# Patient Record
Sex: Female | Born: 1990 | Hispanic: Yes | Marital: Single | Smoking: Never smoker
Health system: Southern US, Community
[De-identification: ages and names within clinical notes are randomized; demographics above are authoritative.]

## PROBLEM LIST (undated history)

## (undated) ENCOUNTER — Inpatient Hospital Stay (HOSPITAL_COMMUNITY): Payer: Self-pay

---

## 2011-11-17 ENCOUNTER — Emergency Department (HOSPITAL_COMMUNITY)
Admission: EM | Admit: 2011-11-17 | Discharge: 2011-11-17 | Disposition: A | Discharge status: Home / Self Care | Payer: Self-pay | Attending: Emergency Medicine | Admitting: Emergency Medicine

## 2011-11-17 ENCOUNTER — Encounter (HOSPITAL_COMMUNITY): Payer: Self-pay

## 2011-11-17 DIAGNOSIS — IMO0002 Reserved for concepts with insufficient information to code with codable children: Secondary | ICD-10-CM | POA: Insufficient documentation

## 2011-11-17 DIAGNOSIS — Y939 Activity, unspecified: Secondary | ICD-10-CM | POA: Insufficient documentation

## 2011-11-17 DIAGNOSIS — Y929 Unspecified place or not applicable: Secondary | ICD-10-CM | POA: Insufficient documentation

## 2011-11-17 DIAGNOSIS — T169XXA Foreign body in ear, unspecified ear, initial encounter: Secondary | ICD-10-CM | POA: Insufficient documentation

## 2011-11-17 MED ORDER — ANTIPYRINE-BENZOCAINE 5.4-1.4 % OT SOLN
6.0000 [drp] | Freq: Once | OTIC | Status: AC
  Administered 2011-11-17: 6 [drp] via OTIC
  Filled 2011-11-17 (×2): qty 10

## 2011-11-17 MED ORDER — NEOMYCIN-POLYMYXIN-HC 3.5-10000-1 OT SOLN
3.0000 [drp] | Freq: Once | OTIC | Status: AC
  Administered 2011-11-17: 3 [drp] via OTIC
  Filled 2011-11-17: qty 10

## 2011-11-17 NOTE — ED Notes (Signed)
Al Decant PA  In and removed bug from left ear. Pt tolerated without difficulty.

## 2011-11-17 NOTE — ED Notes (Signed)
Woke this am with feeling of something crawling in left ear.

## 2011-11-17 NOTE — ED Provider Notes (Signed)
History     CSN: [id number]  Arrival date & time 11/17/11  0715   First MD Initiated Contact with Patient 11/17/11 0805      Chief Complaint  Patient presents with  . Foreign Body in Ear    (Consider location/radiation/quality/duration/timing/severity/associated sxs/prior treatment) HPI Comments: Pt was awakened early this AM when "something crawled in my ear".  Patient is a 21 y.o. female presenting with foreign body in ear. The history is provided by the patient. No language interpreter was used.  Foreign Body in Ear This is a new problem. The current episode started today. The problem has been unchanged. Pertinent negatives include no chills or fever. Nothing aggravates the symptoms. She has tried nothing for the symptoms.    History reviewed. No pertinent past medical history.  History reviewed. No pertinent past surgical history.  No family history on file.  History  Substance Use Topics  . Smoking status: Never Smoker   . Smokeless tobacco: Not on file  . Alcohol Use: No    OB History    Grav Para Term Preterm Abortions TAB SAB Ect Mult Living                  Review of Systems  Constitutional: Negative for fever and chills.  HENT: Negative for hearing loss and ear discharge.   All other systems reviewed and are negative.    Allergies  Review of patient's allergies indicates no known allergies.  Home Medications  No current outpatient prescriptions on file.  BP 123/85  Pulse 95  Temp 98.5 F (36.9 C) (Oral)  Resp 20  Ht 5' (1.524 m)  Wt 100 lb (45.36 kg)  BMI 19.53 kg/m2  SpO2 99%  LMP 11/14/2011  Physical Exam  Nursing note and vitals reviewed. Constitutional: She is oriented to person, place, and time. She appears well-developed and well-nourished. No distress.  HENT:  Head: Normocephalic and atraumatic.  Right Ear: Hearing, tympanic membrane, external ear and ear canal normal.  Left Ear: Hearing, external ear and ear canal normal.    Insect readily seen moving with ophthalmoscope  Eyes: EOM are normal.  Neck: Normal range of motion.  Cardiovascular: Normal rate and regular rhythm.   Pulmonary/Chest: Effort normal.  Abdominal: Soft. She exhibits no distension. There is no tenderness.  Musculoskeletal: Normal range of motion.  Neurological: She is alert and oriented to person, place, and time.  Skin: Skin is warm and dry.  Psychiatric: She has a normal mood and affect. Judgment normal.    ED Course  FOREIGN BODY REMOVAL Date/Time: 11/17/2011 9:00 AM Performed by: Evalina Field Authorized by: Evalina Field Consent: Verbal consent obtained. Written consent not obtained. Risks and benefits: risks, benefits and alternatives were discussed Consent given by: patient Patient understanding: patient states understanding of the procedure being performed Patient consent: the patient's understanding of the procedure matches consent given Site marked: the operative site was not marked Imaging studies: imaging studies not available Patient identity confirmed: verbally with patient Time out: Immediately prior to procedure a "time out" was called to verify the correct patient, procedure, equipment, support staff and site/side marked as required. Body area: ear Location details: left ear Anesthesia method: canal filled with auralgan and allowed tostay ~ 20 min to kill insect. Anesthetic total: 6 drops Patient sedated: no Patient restrained: no Patient cooperative: yes Localization method: ENT speculum Removal mechanism: alligator forceps (small suction catheter.) Complexity: simple 1 objects recovered. Objects recovered: cockroach Post-procedure assessment: foreign body removed Patient tolerance:  Patient tolerated the procedure well with no immediate complications. Comments: Canal appears irritated from insect scratching.  Will put on tobrex x 5 days.   (including critical care time)  Labs Reviewed - No data to  display No results found.   1. Foreign body       MDM  Cortisporin 3 gtts TID x 5 days.        Evalina Field, [place] 11/17/11 [phone]

## 2011-11-17 NOTE — ED Provider Notes (Signed)
Medical screening examination/treatment/procedure(s) were performed by non-physician practitioner and as supervising physician I was immediately available for consultation/collaboration.   Eladio Dentremont W. Dreama Kuna, MD 11/17/11 2005 

## 2012-02-11 ENCOUNTER — Emergency Department (HOSPITAL_COMMUNITY)
Admission: EM | Admit: 2012-02-11 | Discharge: 2012-02-11 | Disposition: A | Discharge status: Home / Self Care | Payer: Self-pay | Attending: Emergency Medicine | Admitting: Emergency Medicine

## 2012-02-11 ENCOUNTER — Encounter (HOSPITAL_COMMUNITY): Payer: Self-pay

## 2012-02-11 DIAGNOSIS — H538 Other visual disturbances: Secondary | ICD-10-CM | POA: Insufficient documentation

## 2012-02-11 DIAGNOSIS — B0230 Zoster ocular disease, unspecified: Secondary | ICD-10-CM | POA: Insufficient documentation

## 2012-02-11 DIAGNOSIS — B009 Herpesviral infection, unspecified: Secondary | ICD-10-CM

## 2012-02-11 DIAGNOSIS — Z79899 Other long term (current) drug therapy: Secondary | ICD-10-CM | POA: Insufficient documentation

## 2012-02-11 MED ORDER — ACYCLOVIR 400 MG PO TABS: 800.0000 mg | ORAL_TABLET | Freq: Every day | ORAL | Status: DC

## 2012-02-11 MED ORDER — TETRACAINE HCL 0.5 % OP SOLN
2.0000 [drp] | Freq: Once | OPHTHALMIC | Status: AC
  Administered 2012-02-11: 2 [drp] via OPHTHALMIC
  Filled 2012-02-11: qty 2

## 2012-02-11 MED ORDER — FLUORESCEIN SODIUM 1 MG OP STRP
1.0000 | ORAL_STRIP | Freq: Once | OPHTHALMIC | Status: AC
  Administered 2012-02-11: 1 via OPHTHALMIC
  Filled 2012-02-11: qty 1

## 2012-02-11 NOTE — ED Notes (Signed)
Rash on rt. Eye lid.  Small red blisters, itchy

## 2012-02-11 NOTE — ED Notes (Signed)
Pt states she awoke today with a rash above her right eye and right eyelid; pt states she does not recall coming into contact with anything or anything biting her; pt states rash is painful 7/10 "very achy"; pt states pain does radiate to behind her ear; pt denying any rashes anywhere else on her body;

## 2012-02-11 NOTE — ED Provider Notes (Signed)
History    Scribed for No att. providers found, the patient was seen in room TR04C/TR04C . This chart was scribed by Lewanda Rife.  CSN: [id number]  Arrival date & time 02/11/12  1643   First MD Initiated Contact with Patient 02/11/12 1909      Chief Complaint  Patient presents with  . Rash    (Consider location/radiation/quality/duration/timing/severity/associated sxs/prior treatment) HPI Rhonda Mccarthy is a 22 y.o. female who presents to the Emergency Department complaining of constant burning over rash on right eye lid onset this morning. Pt describes pain 7/10 in severity, described as burning, non-radiating. Pt reports rash is non-pruritic. Denies weeping or oozing from the site.  Pt denies use of new cosmetics, lotion, perfume or other new environmental contacts.  Pt also denies fever, scratch to right eye lid, diplopia, and eye pain. Pt reports mild blurry vision of right eye. Pt denies hx of herpes and rash elsewhere. Pt denies any significant past medical hx.     History reviewed. No pertinent past medical history.  History reviewed. No pertinent past surgical history.  No family history on file.  History  Substance Use Topics  . Smoking status: Never Smoker   . Smokeless tobacco: Not on file  . Alcohol Use: No    OB History    Grav Para Term Preterm Abortions TAB SAB Ect Mult Living                  Review of Systems  Constitutional: Negative.  Negative for fever.  HENT: Negative.   Respiratory: Negative.   Cardiovascular: Negative.   Gastrointestinal: Negative.   Musculoskeletal: Negative.   Skin: Positive for rash.  Neurological: Negative.   Hematological: Negative.   Psychiatric/Behavioral: Negative.   All other systems reviewed and are negative.    Allergies  Review of patient's allergies indicates no known allergies.  Home Medications   Current Outpatient Rx  Name  Route  Sig  Dispense  Refill  . ACYCLOVIR 400 MG PO TABS   Oral  Take 2 tablets (800 mg total) by mouth 5 (five) times daily.   50 tablet   0     BP 104/64  Pulse 84  Temp 98.5 F (36.9 C) (Oral)  Resp 17  SpO2 100%  LMP 01/14/2012  Physical Exam  Nursing note and vitals reviewed. Constitutional: She is oriented to person, place, and time. She appears well-developed and well-nourished. No distress.  HENT:  Head: Normocephalic and atraumatic.  Right Ear: Hearing, tympanic membrane, external ear and ear canal normal.  Left Ear: Hearing, tympanic membrane, external ear and ear canal normal.  Nose: Nose normal.  Mouth/Throat: Uvula is midline, oropharynx is clear and moist and mucous membranes are normal. Mucous membranes are not dry and not cyanotic. No oropharyngeal exudate, posterior oropharyngeal edema, posterior oropharyngeal erythema or tonsillar abscesses.       No vesicles noted in the ear canal or on the tip of the nose  Eyes: Conjunctivae normal and EOM are normal. Pupils are equal, round, and reactive to light. Right eye exhibits no discharge and no exudate. No foreign body present in the right eye. Left eye exhibits no discharge and no exudate. No foreign body present in the left eye. Right conjunctiva is not injected. Right conjunctiva has no hemorrhage. Left conjunctiva is not injected. Left conjunctiva has no hemorrhage. No scleral icterus. Right eye exhibits normal extraocular motion and no nystagmus. Left eye exhibits normal extraocular motion and no nystagmus.  Fundoscopic exam:  The right eye shows no exudate, no hemorrhage and no papilledema.       The left eye shows no exudate, no hemorrhage and no papilledema.  Slit lamp exam:      The right eye shows no corneal abrasion, no corneal flare, no corneal ulcer, no foreign body, no hyphema, no fluorescein uptake and no anterior chamber bulge.       The left eye shows no corneal abrasion, no corneal flare, no corneal ulcer, no foreign body, no hyphema, no fluorescein uptake and no  anterior chamber bulge.       Grouped vesicular erythematous rash noted on right lid. Nondraining.   No corneal abrasion or dendritic lesions noted on slit-lamp exam bilaterally. No flora seen uptake bilaterally.  Visual Acuity  -  Bilateral Distance:  20/15 ;  R Distance:  20/15 ;  L Distance:  20/15  Neck: Normal range of motion and full passive range of motion without pain. Neck supple. No spinous process tenderness and no muscular tenderness present. No rigidity. No tracheal deviation and normal range of motion present. No Brudzinski's sign and no Kernig's sign noted.  Cardiovascular: Normal rate, regular rhythm, S1 normal, S2 normal and normal heart sounds.   Pulses:      Radial pulses are 2+ on the right side, and 2+ on the left side.       Dorsalis pedis pulses are 2+ on the right side, and 2+ on the left side.       Posterior tibial pulses are 2+ on the right side, and 2+ on the left side.  Pulmonary/Chest: Effort normal. No respiratory distress. She has no wheezes. She has no rales. She exhibits no tenderness.  Musculoskeletal: Normal range of motion. She exhibits no tenderness.  Neurological: She is alert and oriented to person, place, and time. She has normal reflexes. She displays normal reflexes. No cranial nerve deficit. She exhibits normal muscle tone. Coordination normal.  Skin: Skin is warm and dry. Rash noted.  Psychiatric: She has a normal mood and affect. Her behavior is normal.    ED Course  Procedures (including critical care time)  Labs Reviewed - No data to display No results found.   1. Herpes       MDM  [name] presents with rash concerning for herpes simplex versus herpes zoster.  Pt with Hx of Varicella as a child, but not Hx of Zoster.  Patient denies prodromal state fever or malaise and headache.  She denies pain in her eye or tearing. She does admit to mildly decreased vision but visual acuity exam shows no deficit.  Patient without evidence of  Hutchinson sign or vesicular rash anywhere on the forehead or tip of the nose.  No evidence for nasociliary nerve involvement. Cranial nerves intact.  Fluorescein dye and with lamp without evidence of dendritic ulcer. Patient has no photophobia. A slit lamp exam is without evidence of dendritic lesion or corneal ulceration. Pt treated with acyclovir and given opthalmology f/u as a precaution.  I have also discussed reasons to return immediately to the ER.  Patient expresses understanding and agrees with plan.  1. Medications: acyclovir, usual home medications 2. Treatment: rest, drink plenty of fluids, take medications as prescribed, wash your hands frequently, do not touch your eye 3. Follow Up: Please followup with your primary doctor for discussion of your diagnoses and further evaluation after today's visit; if you do not have a primary care doctor use the resource guide provided to find  one; follow-up with opthamology  I personally performed the services described in this documentation, which was scribed in my presence. The recorded information has been reviewed and is accurate.   Dahlia Client Chaise Passarella, PA-C 02/11/12 2351

## 2012-02-12 NOTE — ED Provider Notes (Signed)
Medical screening examination/treatment/procedure(s) were performed by non-physician practitioner and as supervising physician I was immediately available for consultation/collaboration.   Kierah Goatley M Nyquan Selbe, DO 02/12/12 1608 

## 2012-12-03 ENCOUNTER — Emergency Department (HOSPITAL_COMMUNITY)
Admission: EM | Admit: 2012-12-03 | Discharge: 2012-12-03 | Disposition: A | Discharge status: Home / Self Care | Payer: No Typology Code available for payment source | Attending: Emergency Medicine | Admitting: Emergency Medicine

## 2012-12-03 ENCOUNTER — Emergency Department (HOSPITAL_COMMUNITY): Payer: Self-pay

## 2012-12-03 ENCOUNTER — Encounter (HOSPITAL_COMMUNITY): Payer: Self-pay | Admitting: Emergency Medicine

## 2012-12-03 DIAGNOSIS — Y92410 Unspecified street and highway as the place of occurrence of the external cause: Secondary | ICD-10-CM | POA: Insufficient documentation

## 2012-12-03 DIAGNOSIS — Z79899 Other long term (current) drug therapy: Secondary | ICD-10-CM | POA: Insufficient documentation

## 2012-12-03 DIAGNOSIS — S39012A Strain of muscle, fascia and tendon of lower back, initial encounter: Secondary | ICD-10-CM

## 2012-12-03 DIAGNOSIS — M549 Dorsalgia, unspecified: Secondary | ICD-10-CM

## 2012-12-03 DIAGNOSIS — S335XXA Sprain of ligaments of lumbar spine, initial encounter: Secondary | ICD-10-CM | POA: Insufficient documentation

## 2012-12-03 DIAGNOSIS — Y9389 Activity, other specified: Secondary | ICD-10-CM | POA: Insufficient documentation

## 2012-12-03 MED ORDER — CYCLOBENZAPRINE HCL 10 MG PO TABS: 10.0000 mg | ORAL_TABLET | Freq: Two times a day (BID) | ORAL | Status: DC | PRN

## 2012-12-03 MED ORDER — NAPROXEN 500 MG PO TABS: 500.0000 mg | ORAL_TABLET | Freq: Two times a day (BID) | ORAL | Status: DC
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
## 2012-12-03 NOTE — ED Notes (Signed)
C/o mid- thoracic and lumbar back pain. Pt is in no distress.
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
## 2012-12-06 NOTE — ED Provider Notes (Signed)
Medical screening examination/treatment/procedure(s) were performed by non-physician practitioner and as supervising physician I was immediately available for consultation/collaboration.  EKG Interpretation   None        Claudett Bayly R. Mandolin Falwell, MD 12/06/12 0923 

## 2014-11-17 ENCOUNTER — Encounter (HOSPITAL_COMMUNITY): Payer: Self-pay | Admitting: *Deleted

## 2014-11-17 ENCOUNTER — Inpatient Hospital Stay (HOSPITAL_COMMUNITY)
Admission: AD | Admit: 2014-11-17 | Discharge: 2014-11-17 | Disposition: A | Discharge status: Home / Self Care | Payer: Self-pay | Source: Ambulatory Visit | Attending: Obstetrics and Gynecology | Admitting: Obstetrics and Gynecology

## 2014-11-17 DIAGNOSIS — N949 Unspecified condition associated with female genital organs and menstrual cycle: Secondary | ICD-10-CM

## 2014-11-17 DIAGNOSIS — R1030 Lower abdominal pain, unspecified: Secondary | ICD-10-CM | POA: Insufficient documentation

## 2014-11-17 DIAGNOSIS — R102 Pelvic and perineal pain: Secondary | ICD-10-CM | POA: Insufficient documentation

## 2014-11-17 DIAGNOSIS — O26892 Other specified pregnancy related conditions, second trimester: Secondary | ICD-10-CM | POA: Insufficient documentation

## 2014-11-17 DIAGNOSIS — Z3A16 16 weeks gestation of pregnancy: Secondary | ICD-10-CM | POA: Insufficient documentation

## 2014-11-17 LAB — URINALYSIS, ROUTINE W REFLEX MICROSCOPIC
BILIRUBIN URINE: NEGATIVE
GLUCOSE, UA: NEGATIVE mg/dL
Hgb urine dipstick: NEGATIVE
KETONES UR: 15 mg/dL — AB
LEUKOCYTES UA: NEGATIVE
NITRITE: NEGATIVE
PROTEIN: NEGATIVE mg/dL
Specific Gravity, Urine: 1.02 (ref 1.005–1.030)
Urobilinogen, UA: 0.2 mg/dL (ref 0.0–1.0)
pH: 6.5 (ref 5.0–8.0)

## 2014-11-17 NOTE — Discharge Instructions (Signed)

## 2014-11-17 NOTE — MAU Note (Signed)
Lower abd pain for the last several days, feels like something is "pushing down" when she walks.  Pos HPT last month.  Denies bleeding or discharge.

## 2014-11-17 NOTE — MAU Provider Note (Signed)
History     CSN: [id number]  Arrival date and time: 11/17/14 1013   First Provider Initiated Contact with Patient 11/17/14 1128      Chief Complaint  Patient presents with  . Abdominal Pain   HPI Ms. Rhonda Mccarthy is a 24 y.o. G1P0 at 556w4d who presents to MAU today with complaint of lower abdominal pain x 3 days. The patient states that pain is intermittent and worse with ambulation. She has not taken anything for pain. She denies vaginal bleeding, discharge, UTI symptoms or fever.    OB History    Gravida Para Term Preterm AB TAB SAB Ectopic Multiple Living   1               History reviewed. No pertinent past medical history.  History reviewed. No pertinent past surgical history.  History reviewed. No pertinent family history.  Social History  Substance Use Topics  . Smoking status: Never Smoker   . Smokeless tobacco: None  . Alcohol Use: No    Allergies: No Known Allergies  Prescriptions prior to admission  Medication Sig Dispense Refill Last Dose  . [DISCONTINUED] cyclobenzaprine (FLEXERIL) 10 MG tablet Take 1 tablet (10 mg total) by mouth 2 (two) times daily as needed for muscle spasms. 20 tablet 0   . [DISCONTINUED] naproxen (NAPROSYN) 500 MG tablet Take 1 tablet (500 mg total) by mouth 2 (two) times daily. 30 tablet 0     Review of Systems  Constitutional: Negative for fever and malaise/fatigue.  Gastrointestinal: Positive for abdominal pain.  Genitourinary: Negative for dysuria, urgency and frequency.       Neg - vaginal bleeding, discharge   Physical Exam   Blood pressure 110/50, pulse 78, temperature 98.2 F (36.8 C), temperature source Oral, resp. rate 18, height 4\' 11"  (1.499 m), weight 102 lb 12.8 oz (46.63 kg), last menstrual period 07/24/2014.  Physical Exam  Nursing note and vitals reviewed. Constitutional: She is oriented to person, place, and time. She appears well-developed and well-nourished. No distress.  HENT:  Head:  Normocephalic and atraumatic.  Cardiovascular: Normal rate.   Respiratory: Effort normal.  GI: Soft. She exhibits no distension and no mass. There is no tenderness. There is no rebound and no guarding.  Neurological: She is alert and oriented to person, place, and time.  Skin: Skin is warm and dry. No erythema.  Psychiatric: She has a normal mood and affect.  Dilation: Closed Exam by:: Harlon FlorJ Wenzel PA  Results for orders placed or performed during the hospital encounter of 11/17/14 (from the past 24 hour(s))  Urinalysis, Routine w reflex microscopic (not at [facility])     Status: Abnormal   Collection Time: 11/17/14 10:40 AM  Result Value Ref Range   Color, Urine YELLOW YELLOW   APPearance CLEAR CLEAR   Specific Gravity, Urine 1.020 1.005 - 1.030   pH 6.5 5.0 - 8.0   Glucose, UA NEGATIVE NEGATIVE mg/dL   Hgb urine dipstick NEGATIVE NEGATIVE   Bilirubin Urine NEGATIVE NEGATIVE   Ketones, ur 15 (A) NEGATIVE mg/dL   Protein, ur NEGATIVE NEGATIVE mg/dL   Urobilinogen, UA 0.2 0.0 - 1.0 mg/dL   Nitrite NEGATIVE NEGATIVE   Leukocytes, UA NEGATIVE NEGATIVE     MAU Course  Procedures None  MDM UA today FHR - 151 bpm with doppler  Assessment and Plan  A: SIUP at 2556w4d Round ligament pain  P: Discharge home Tylenol PRN for pain advised Second trimester precautions discussed Patient advised to follow-up with [facility]  to start prenatal care 11/29/14 at 1:00 pm Patient may return to MAU as needed or if her condition were to change or worsen   Marny Lowenstein, PA-C  11/17/2014, 11:28 AM

## 2014-11-29 ENCOUNTER — Ambulatory Visit (INDEPENDENT_AMBULATORY_CARE_PROVIDER_SITE_OTHER): Payer: Self-pay | Admitting: Student

## 2014-11-29 ENCOUNTER — Encounter: Payer: Self-pay | Admitting: Student

## 2014-11-29 VITALS — BP 115/72 | HR 82 | Temp 98.5°F | Wt 103.7 lb

## 2014-11-29 DIAGNOSIS — Z3492 Encounter for supervision of normal pregnancy, unspecified, second trimester: Secondary | ICD-10-CM

## 2014-11-29 DIAGNOSIS — Z113 Encounter for screening for infections with a predominantly sexual mode of transmission: Secondary | ICD-10-CM

## 2014-11-29 DIAGNOSIS — Z124 Encounter for screening for malignant neoplasm of cervix: Secondary | ICD-10-CM

## 2014-11-29 DIAGNOSIS — O0932 Supervision of pregnancy with insufficient antenatal care, second trimester: Secondary | ICD-10-CM

## 2014-11-29 LAB — POCT URINALYSIS DIP (DEVICE)
Bilirubin Urine: NEGATIVE
GLUCOSE, UA: NEGATIVE mg/dL
Hgb urine dipstick: NEGATIVE
Ketones, ur: >=160 mg/dL — AB
Leukocytes, UA: NEGATIVE
Nitrite: NEGATIVE
PH: 5.5 (ref 5.0–8.0)
PROTEIN: NEGATIVE mg/dL
UROBILINOGEN UA: 0.2 mg/dL (ref 0.0–1.0)

## 2014-11-29 MED ORDER — PROMETHAZINE HCL 25 MG PO TABS: 12.5000 mg | ORAL_TABLET | Freq: Four times a day (QID) | ORAL | Status: DC | PRN

## 2014-11-29 MED ORDER — PRENATAL PLUS 27-1 MG PO TABS: 1.0000 | ORAL_TABLET | Freq: Every day | ORAL | Status: AC

## 2014-11-29 NOTE — Patient Instructions (Signed)
Segundo trimestre de Media planner (Second Trimester of Pregnancy) El segundo trimestre va desde la semana13 hasta la 66, desde el cuarto hasta el sexto mes, y suele ser el momento en el que mejor se siente. En general, las nuseas matutinas han disminuido o han desaparecido completamente. Tendr ms energa y podr aumentarle el apetito. El beb por nacer (feto) se desarrolla rpidamente. Hacia el final del sexto mes, el beb mide aproximadamente 9 pulgadas (23 cm) y pesa alrededor de 1 libras (700 g). Es probable que sienta al beb moverse (dar pataditas) entre las 18 y 4 semanas del Media planner. CUIDADOS EN EL HOGAR   No fume, no consuma hierbas ni beba alcohol. No tome frmacos que el mdico no haya autorizado.  No consuma ningn producto que contenga tabaco, lo que incluye cigarrillos, tabaco de Higher education careers adviser o Psychologist, sport and exercise. Si necesita ayuda para dejar de fumar, consulte al [employer]. Puede recibir asesoramiento u otro tipo de apoyo para dejar de fumar.  Tome los medicamentos solamente como se lo haya indicado el mdico. Algunos medicamentos son seguros para tomar durante el Media planner y otros no lo son.  Haga ejercicios solamente como se lo haya indicado el mdico. Interrumpa la actividad fsica si comienza a tener calambres.  Ingiera alimentos saludables de [place] regular.  Use un sostn que le brinde buen soporte si sus mamas estn sensibles.  No se d baos de inmersin en agua caliente, baos turcos ni saunas.  Colquese el cinturn de seguridad cuando conduzca.  No coma carne cruda ni queso sin cocinar; evite el contacto con las bandejas sanitarias de los gatos y la tierra que estos animales usan.  White.  Tome entre 1500 y 2000mg  de calcio diariamente comenzando en la [id number] del embarazo [place].  Pruebe tomar un medicamento que la ayude a defecar (un laxante suave) si el mdico lo autoriza. Consuma ms fibra, que se encuentra en las frutas y  verduras frescas y los cereales integrales. Beba suficiente lquido para mantener el pis (orina) claro o de color amarillo plido.  Dese baos de asiento con agua tibia para Best boy o las molestias causadas por las hemorroides. Use una crema para las hemorroides si el mdico la autoriza.  Si se le hinchan las venas (venas varicosas), use medias de descanso. Levante (eleve) los pies durante 73minutos, 3 o 4veces por Training and development officer. Limite el consumo de sal en su dieta.  No levante objetos pesados, use zapatos de tacones bajos y sintese derecha.  Descanse con las piernas elevadas si tiene calambres o dolor de cintura.  Visite a su dentista si no lo ha Quarry manager. Use un cepillo de cerdas suaves para cepillarse los dientes. Psese el hilo dental con suavidad.  Puede seguir [employer], a menos que el mdico le indique lo contrario.  Concurra a los controles mdicos. SOLICITE AYUDA SI:   Siente mareos.  Sufre calambres o presin leves en la parte baja del vientre (abdomen).  Sufre un dolor persistente en el abdomen.  Tiene Higher education careers adviser (nuseas), vmitos, o tiene deposiciones acuosas (diarrea).  Advierte un olor ftido que proviene de la vagina.  Siente dolor al [employer]. SOLICITE AYUDA DE INMEDIATO SI:   Tiene fiebre.  Tiene una prdida de lquido por la vagina.  Tiene sangrado o pequeas prdidas vaginales.  Siente dolor intenso o clicos en el abdomen.  Sube o baja de peso rpidamente.  Tiene dificultades para recuperar el aliento y siente dolor en el pecho.  Sbitamente  se le hinchan mucho el rostro, las manos, los tobillos, los pies o las piernas.  No ha sentido los movimientos del beb durante Georgianne Fickuna hora.  Siente un dolor de cabeza intenso que no se alivia con medicamentos.  Su vValley Springsisin se modifica.   Esta informacin no tiene Theme park managercomo fin reemplazar el consejo del mdico. Asegrese de hacerle al mdico cualquier pregunta que  tenga.   Document Released: 09/08/2012 Document Revised: 01/27/2014 Elsevier Interactive Patient Education 2016 [employer]    The ServiceMaster CompanyLas medicinas seguras para tomar durante el embarazo  Safe Medications in Pregnancy  Acn:  Benzoyl Peroxide (Perxido de benzolo)  Salicylic Acid (cido saliclico)  Dolor de espalda/Dolor de cabeza:  Tylenol: 2 pastillas de concentracin regular cada 4 horas O 2 pastillas de concentracin fuerte cada 6 horas  Resfriados/Tos/Alergias:  Benadryl (sin alcohol) 25 mg cada 6 horas segn lo necesite Breath Right strips (Tiras para respirar correctamente)  Claritin  Cepacol (pastillas de chupar para la garganta)  Chloraseptic (aerosol para la garganta)  Cold-Eeze- hasta tres veces por da  Cough drops (pastillas de chupar para la tos, sin alcohol)  Flonase (con receta mdica solamente)  Guaifenesin  Mucinex  Robitussin DM (simple solamente, sin alcohol)  Saline nasal spray/drops (Aerosol nasal salino/gotas) Sudafed (pseudoephedrine) y  Actifed * utilizar slo despus de 12 semanas de gestacin y si no tiene la presin arterial alta.  Tylenol Vicks  VapoRub  Zinc lozenges (pastillas para la garganta)  Zyrtec  Estreimiento:  Colace  Ducolax (supositorios)  Fleet enema (lavado intestinal rectal)  Glycerin (supositorios)  Metamucil  Milk of magnesia (leche de magnesia)  Miralax  Senokot  Smooth Move (t)  Diarrea:  Kaopectate Imodium A-D  *NO tome Pepto-Bismol  Hemorroides:  Anusol  Anusol HC  Preparation H  Tucks  Indigestin:  Tums  Maalox  Mylanta  Zantac  Pepcid  Insomnia:  Benadryl (sin alcohol) 25mg  cada 6 horas segn lo necesite  Tylenol PM  Unisom, no Gelcaps  Calambres en las piernas:  Tums  MagGel Nuseas/Vmitos:  Bonine  Dramamine  Emetrol  Ginger (extracto)  Sea-Bands  Meclizine  Medicina para las nuseas que puede tomar durante el embarazo: Unisom (doxylamine succinate, pastillas de 25 mg) Tome una pastilla al  da al Baneberryacostarse. Si los sntomas no estn adecuadamente controlados, la dosis puede aumentarse hasta una dosis mxima recomendada de Liberty Mutualdos pastillas al da (1/2 pastilla por la Chinlemaana, 1/2 pastilla a media tarde y Neomia Dearuna pastilla al Seboyetaacostarse). Pastillas de Vitamina B6 de 100mg . Tome ConAgra Foodsuna pastilla dos veces al da (hasta 200 mg por da).  Erupciones en la piel:  Productos de Aveeno  Benadryl cream (crema o una dosis de 25mg  cada 6 horas segn lo necesite)  Calamine Lotion (locin)  1% cortisone cream (crema de cortisona de 1%)  nfeccin vaginal por hongos (candidiasis):  Gyne-lotrimin 7  Monistat 7   **Si est tomando varias medicinas, por favor revise las etiquetas para Art gallery managerevitar duplicar los mismos ingredientes Oceanoactivos. **Tome la medicina segn lo indicado en la etiqueta. **No tome ms de 400 mg de Tylenol en 24 horas. **No tome medicinas que contengan aspirina o ibuprofeno.

## 2014-11-29 NOTE — Progress Notes (Signed)
Pressure- lower abd New ob packet given  Anatomy US scheduled for November 16th @ 1400

## 2014-11-29 NOTE — Progress Notes (Signed)
   Subjective:    Rhonda Mccarthy is being seen today for her first obstetrical visit. She is at 2255w1d gestation. Her obstetrical history is significant for TSVD. Relationship with FOB: significant other, living together. Patient does intend to breast feed. Pregnancy history fully reviewed.  Patient reports no complaints.  Review of Systems:   Review of Systems Review of Systems - History obtained from the patient Gastrointestinal ROS: no abdominal pain, change in bowel habits, or black or bloody stools Genito-Urinary ROS: negative for - dysuria, genital discharge or vaginal bleeding  Objective:     BP 115/72 mmHg  Pulse 82  Temp(Src) 98.5 F (36.9 C)  Wt 103 lb 11.2 oz (47.038 kg)  LMP 07/24/2014 Physical Exam  Exam  Constitutional: Oriented x 3. Well developed and well nourished. Cardiovascular: RRR, no murmur Respiratory: Normal effort. Lung sound clear bilaterally Gastrointestinal: abdomen soft & non tender. Bowel sounds x 4 Pelvic exam: cervix closed. No CMT. Uterus enlarged.   Assessment:    Pregnancy: G2P1001 Patient Active Problem List   Diagnosis Date Noted  . Supervision of normal pregnancy in second trimester 11/29/2014       Plan:   1. Supervision of normal pregnancy in second trimester  - Cytology - PAP - US MFM OB COMP + 14 WK; Future - GC/Chlamydia probe amp ([place])not at [facility] - Culture, OB Urine - Prescript Monitor Profile(19) - Prenatal Profile - Hemoglobinopathy evaluation - prenatal vitamin w/FE, FA (PRENATAL 1 + 1) 27-1 MG TABS tablet; Take 1 tablet by mouth daily at 12 noon.  Dispense: 30 each; Refill: 4 - promethazine (PHENERGAN) 25 MG tablet; Take 0.5-1 tablets (12.5-25 mg total) by mouth every 6 (six) hours as needed for nausea or vomiting.  Dispense: 30 tablet; Refill: 1 - Wet prep, genital - POCT urinalysis dip (device)    Initial labs drawn. Prenatal vitamins. Problem list reviewed and updated. AFP3 discussed:  declined. Role of ultrasound in pregnancy discussed; fetal survey: ordered. Amniocentesis discussed: not indicated. Follow up in 4 weeks.    Rhonda Mccarthy 12/05/2014

## 2014-11-30 LAB — PRESCRIPTION MONITORING PROFILE (19 PANEL)
Amphetamine/Meth: NEGATIVE ng/mL
BARBITURATE SCREEN, URINE: NEGATIVE ng/mL
BENZODIAZEPINE SCREEN, URINE: NEGATIVE ng/mL
Buprenorphine, Urine: NEGATIVE ng/mL
CANNABINOID SCRN UR: NEGATIVE ng/mL
COCAINE METABOLITES: NEGATIVE ng/mL
CREATININE, URINE: 192.1 mg/dL (ref >20.0)
Carisoprodol, Urine: NEGATIVE ng/mL
Fentanyl, Ur: NEGATIVE ng/mL
MDMA URINE: NEGATIVE ng/mL
METHAQUALONE SCREEN (URINE): NEGATIVE ng/mL
Meperidine, Ur: NEGATIVE ng/mL
Methadone Screen, Urine: NEGATIVE ng/mL
Nitrites, Initial: NEGATIVE ug/mL
OPIATE SCREEN, URINE: NEGATIVE ng/mL
Oxycodone Screen, Ur: NEGATIVE ng/mL
PH URINE, INITIAL: 5.4 pH (ref 4.5–8.9)
PHENCYCLIDINE, UR: NEGATIVE ng/mL
Propoxyphene: NEGATIVE ng/mL
Tapentadol, urine: NEGATIVE ng/mL
Tramadol Scrn, Ur: NEGATIVE ng/mL
ZOLPIDEM, URINE: NEGATIVE ng/mL

## 2014-11-30 LAB — PRENATAL PROFILE (SOLSTAS)
ANTIBODY SCREEN: NEGATIVE
BASOS PCT: 0 % (ref 0–1)
Basophils Absolute: 0 10*3/uL (ref 0.0–0.1)
EOS ABS: 0 10*3/uL (ref 0.0–0.7)
EOS PCT: 0 % (ref 0–5)
HEMATOCRIT: 31.6 % — AB (ref 36.0–46.0)
HEMOGLOBIN: 10.4 g/dL — AB (ref 12.0–15.0)
HIV 1&2 Ab, 4th Generation: NONREACTIVE
Hepatitis B Surface Ag: NEGATIVE
Lymphocytes Relative: 16 % (ref 12–46)
Lymphs Abs: 1.5 10*3/uL (ref 0.7–4.0)
MCH: 27.3 pg (ref 26.0–34.0)
MCHC: 32.9 g/dL (ref 30.0–36.0)
MCV: 82.9 fL (ref 78.0–100.0)
MONO ABS: 0.5 10*3/uL (ref 0.1–1.0)
MPV: 10.8 fL (ref 8.6–12.4)
Monocytes Relative: 5 % (ref 3–12)
NEUTROS ABS: 7.3 10*3/uL (ref 1.7–7.7)
Neutrophils Relative %: 79 % — ABNORMAL HIGH (ref 43–77)
Platelets: 240 10*3/uL (ref 150–400)
RBC: 3.81 MIL/uL — AB (ref 3.87–5.11)
RDW: 14.1 % (ref 11.5–15.5)
RH TYPE: NEGATIVE
RUBELLA: 1.71 {index} — AB (ref <0.90)
WBC: 9.3 10*3/uL (ref 4.0–10.5)

## 2014-11-30 LAB — CULTURE, OB URINE

## 2014-11-30 LAB — WET PREP, GENITAL
Clue Cells Wet Prep HPF POC: NONE SEEN
Trich, Wet Prep: NONE SEEN
Yeast Wet Prep HPF POC: NONE SEEN

## 2014-11-30 LAB — CYTOLOGY - PAP

## 2014-11-30 LAB — GC/CHLAMYDIA PROBE AMP (~~LOC~~) NOT AT ARMC
Chlamydia: NEGATIVE
Neisseria Gonorrhea: NEGATIVE

## 2014-12-01 LAB — HEMOGLOBINOPATHY EVALUATION
HEMOGLOBIN OTHER: 0 %
HGB A2 QUANT: 2.9 % (ref 2.2–3.2)
HGB A: 97.1 % (ref 96.8–97.8)
HGB F QUANT: 0 % (ref 0.0–2.0)
Hgb S Quant: 0 %

## 2014-12-06 ENCOUNTER — Other Ambulatory Visit: Payer: Self-pay | Admitting: Student

## 2014-12-06 ENCOUNTER — Ambulatory Visit (HOSPITAL_COMMUNITY)
Admission: RE | Admit: 2014-12-06 | Discharge: 2014-12-06 | Disposition: A | Discharge status: Home / Self Care | Payer: No Typology Code available for payment source | Source: Ambulatory Visit | Attending: Student | Admitting: Student

## 2014-12-06 DIAGNOSIS — Z3492 Encounter for supervision of normal pregnancy, unspecified, second trimester: Secondary | ICD-10-CM

## 2014-12-06 DIAGNOSIS — Z3689 Encounter for other specified antenatal screening: Secondary | ICD-10-CM

## 2014-12-06 DIAGNOSIS — Z3A19 19 weeks gestation of pregnancy: Secondary | ICD-10-CM

## 2014-12-06 DIAGNOSIS — Z36 Encounter for antenatal screening of mother: Secondary | ICD-10-CM | POA: Insufficient documentation

## 2014-12-27 ENCOUNTER — Encounter: Payer: Self-pay | Admitting: Obstetrics & Gynecology

## 2015-01-21 NOTE — L&D Delivery Note (Signed)
Delivery Note At 2:04 PM a viable female was delivered via Vaginal, Spontaneous Delivery (Presentation: Left Occiput Anterior).  APGAR:9 ,9 ; weight  .   Placenta status:spont , 3vc.  Cord:3vc  with the following complications:none .  Cord pH: n/a  Anesthesia: Epidural  Episiotomy:  none Lacerations:  none Suture Repair: n/a Est. Blood Loss25 (mL):    Mom to postpartum.  Baby to Couplet care / Skin to Skin.  Wyvonnia DuskyLAWSON, MARIE DARLENE 04/22/2015, 2:14 PM

## 2015-01-23 ENCOUNTER — Encounter: Payer: Self-pay | Admitting: Obstetrics & Gynecology

## 2015-01-23 ENCOUNTER — Encounter: Payer: Self-pay | Admitting: Advanced Practice Midwife

## 2015-01-23 ENCOUNTER — Telehealth: Payer: Self-pay | Admitting: Obstetrics & Gynecology

## 2015-01-23 NOTE — Telephone Encounter (Signed)
Called patient about missed appointment. Left message to call us back. °

## 2015-01-31 ENCOUNTER — Ambulatory Visit (INDEPENDENT_AMBULATORY_CARE_PROVIDER_SITE_OTHER): Payer: Self-pay | Admitting: Advanced Practice Midwife

## 2015-01-31 VITALS — BP 106/67 | HR 97 | Temp 98.8°F | Wt 113.8 lb

## 2015-01-31 DIAGNOSIS — Z3492 Encounter for supervision of normal pregnancy, unspecified, second trimester: Secondary | ICD-10-CM

## 2015-01-31 DIAGNOSIS — Z3482 Encounter for supervision of other normal pregnancy, second trimester: Secondary | ICD-10-CM

## 2015-01-31 LAB — CBC
HCT: 28.3 % — ABNORMAL LOW (ref 36.0–46.0)
HEMOGLOBIN: 9 g/dL — AB (ref 12.0–15.0)
MCH: 26.2 pg (ref 26.0–34.0)
MCHC: 31.8 g/dL (ref 30.0–36.0)
MCV: 82.3 fL (ref 78.0–100.0)
MPV: 10.7 fL (ref 8.6–12.4)
Platelets: 217 10*3/uL (ref 150–400)
RBC: 3.44 MIL/uL — AB (ref 3.87–5.11)
RDW: 13.7 % (ref 11.5–15.5)
WBC: 9.2 10*3/uL (ref 4.0–10.5)

## 2015-01-31 LAB — POCT URINALYSIS DIP (DEVICE)
BILIRUBIN URINE: NEGATIVE
Glucose, UA: NEGATIVE mg/dL
HGB URINE DIPSTICK: NEGATIVE
KETONES UR: NEGATIVE mg/dL
Nitrite: NEGATIVE
Protein, ur: NEGATIVE mg/dL
SPECIFIC GRAVITY, URINE: 1.02 (ref 1.005–1.030)
Urobilinogen, UA: 0.2 mg/dL (ref 0.0–1.0)
pH: 7 (ref 5.0–8.0)

## 2015-01-31 MED ORDER — TETANUS-DIPHTH-ACELL PERTUSSIS 5-2.5-18.5 LF-MCG/0.5 IM SUSP
0.5000 mL | Freq: Once | INTRAMUSCULAR | Status: AC
  Administered 2015-01-31: 0.5 mL via INTRAMUSCULAR

## 2015-01-31 NOTE — Progress Notes (Signed)
Subjective:  Rhonda Mccarthy is a 25 y.o. G2P1001 at 7640w2d being seen today for ongoing prenatal care.  She is currently monitored for the following issues for this low-risk pregnancy and has Supervision of normal pregnancy in second trimester on her problem list.  Patient reports no complaints.  Contractions: Irregular. Vag. Bleeding: None.  Movement: Present. Denies leaking of fluid.   The following portions of the patient's history were reviewed and updated as appropriate: allergies, current medications, past family history, past medical history, past social history, past surgical history and problem list. Problem list updated.  Objective:   Filed Vitals:   01/31/15 1012  BP: 106/67  Pulse: 97  Temp: 98.8 F (37.1 C)  Weight: 113 lb 12.8 oz (51.619 kg)    Fetal Status: Fetal Heart Rate (bpm): 142 Fundal Height: 28 cm Movement: Present     General:  Alert, oriented and cooperative. Patient is in no acute distress.  Skin: Skin is warm and dry. No rash noted.   Cardiovascular: Normal heart rate noted  Respiratory: Normal respiratory effort, no problems with respiration noted  Abdomen: Soft, gravid, appropriate for gestational age. Pain/Pressure: Present     Pelvic: Vag. Bleeding: None     Cervical exam deferred        Extremities: Normal range of motion.  Edema: None  Mental Status: Normal mood and affect. Normal behavior. Normal judgment and thought content.   Urinalysis: Urine Protein: Negative Urine Glucose: Negative  Assessment and Plan:  Pregnancy: G2P1001 at 1140w2d  1. Supervision of normal pregnancy in second trimester  - Glucose Tolerance, 1 HR (50g) - RPR - HIV antibody - CBC  Preterm labor symptoms and general obstetric precautions including but not limited to vaginal bleeding, contractions, leaking of fluid and fetal movement were reviewed in detail with the patient. Please refer to After Visit Summary for other counseling recommendations.  Return in about 2  weeks (around 02/14/2015).   Hurshel PartyLisa A Leftwich-Kirby, CNM

## 2015-01-31 NOTE — Patient Instructions (Signed)
Iron-Rich Diet Iron is a mineral that helps your body to produce hemoglobin. Hemoglobin is a protein in your red blood cells that carries oxygen to your body's tissues. Eating too little iron may cause you to feel weak and tired, and it can increase your risk for infection. Eating enough iron is necessary for your body's metabolism, muscle function, and nervous system. Iron is naturally found in many foods. It can also be added to foods or fortified in foods. There are two types of dietary iron:  Heme iron. Heme iron is absorbed by the body more easily than nonheme iron. Heme iron is found in meat, poultry, and fish.  Nonheme iron. Nonheme iron is found in dietary supplements, iron-fortified grains, beans, and vegetables. You may need to follow an iron-rich diet if:  You have been diagnosed with iron deficiency or iron-deficiency anemia.  You have a condition that prevents you from absorbing dietary iron, such as:  Infection in your intestines.  Celiac disease. This involves long-lasting (chronic) inflammation of your intestines.  You do not eat enough iron.  You eat a diet that is high in foods that impair iron absorption.  You have lost a lot of blood.  You have heavy bleeding during your menstrual cycle.  You are pregnant. WHAT IS MY PLAN? Your health care provider may help you to determine how much iron you need per day based on your condition. Generally, when a person consumes sufficient amounts of iron in the diet, the following iron needs are met:  Men.  56-2 years old: 11 mg per day.  12-73 years old: 8 mg per day.  Women.   56-4 years old: 15 mg per day.  35-31 years old: 18 mg per day.  Over 82 years old: 8 mg per day.  Pregnant women: 27 mg per day.  Breastfeeding women: 9 mg per day. WHAT DO I NEED TO KNOW ABOUT AN IRON-RICH DIET?  Eat fresh fruits and vegetables that are high in vitamin C along with foods that are high in iron. This will help increase  the amount of iron that your body absorbs from food, especially with foods containing nonheme iron. Foods that are high in vitamin C include oranges, peppers, tomatoes, and mango.  Take iron supplements only as directed by your health care provider. Overdose of iron can be life-threatening. If you were prescribed iron supplements, take them with orange juice or a vitamin C supplement.  Cook foods in pots and pans that are made from iron.   Eat nonheme iron-containing foods alongside foods that are high in heme iron. This helps to improve your iron absorption.   Certain foods and drinks contain compounds that impair iron absorption. Avoid eating these foods in the same meal as iron-rich foods or with iron supplements. These include:  Coffee, black tea, and red wine.  Milk, dairy products, and foods that are high in calcium.  Beans, soybeans, and peas.  Whole grains.  When eating foods that contain both nonheme iron and compounds that impair iron absorption, follow these tips to absorb iron better.   Soak beans overnight before cooking.  Soak whole grains overnight and drain them before using.  Ferment flours before baking, such as using yeast in bread dough. WHAT FOODS CAN I EAT? Grains Iron-fortified breakfast cereal. Iron-fortified whole-wheat bread. Enriched rice. Sprouted grains. Vegetables Spinach. Potatoes with skin. Green peas. Broccoli. Red and green bell peppers. Fermented vegetables. Fruits Prunes. Raisins. Oranges. Strawberries. Mango. Grapefruit. Meats and Other Protein  Sources Beef liver. Oysters. Beef. Shrimp. [place]. Chicken. [place]. Sardines. Chickpeas. Nuts. Tofu. Beverages Tomato juice. Fresh orange juice. Prune juice. Hibiscus tea. Fortified instant breakfast shakes. Condiments Tahini. Fermented soy sauce. Sweets and Desserts Black-strap molasses.  Other Wheat germ. The items listed above may not be a complete list of recommended foods or  beverages. Contact your dietitian for more options. WHAT FOODS ARE NOT RECOMMENDED? Grains Whole grains. Bran cereal. Bran flour. Oats. Vegetables Artichokes. Brussels sprouts. Kale. Fruits Blueberries. Raspberries. Strawberries. Figs. Meats and Other Protein Sources Soybeans. Products made from soy protein. Dairy Milk. Cream. Cheese. Yogurt. Cottage cheese. Beverages Coffee. Black tea. Red wine. Sweets and Desserts Cocoa. Chocolate. Ice cream. Other Basil. Oregano. Parsley. The items listed above may not be a complete list of foods and beverages to avoid. Contact your dietitian for more information.   This information is not intended to replace advice given to you by your health care provider. Make sure you discuss any questions you have with your health care provider.   Document Released: 08/20/2004 Document Revised: 01/27/2014 Document Reviewed: 08/03/2013 Elsevier Interactive Patient Education [employer].

## 2015-01-31 NOTE — Progress Notes (Signed)
Pt reports she has been having frequent episodes of dizziness and she fell @ work 2 days ago. She did not seek medical treatment and reports good FM since the fall. Breastfeeding tip of the week reviewed.

## 2015-02-01 LAB — GLUCOSE TOLERANCE, 1 HOUR (50G) W/O FASTING: Glucose, 1 Hour GTT: 113 mg/dL (ref 70–140)

## 2015-02-01 LAB — HIV ANTIBODY (ROUTINE TESTING W REFLEX): HIV: NONREACTIVE

## 2015-02-02 ENCOUNTER — Encounter: Payer: Self-pay | Admitting: Advanced Practice Midwife

## 2015-02-02 ENCOUNTER — Telehealth: Payer: Self-pay | Admitting: *Deleted

## 2015-02-02 DIAGNOSIS — Z6791 Unspecified blood type, Rh negative: Secondary | ICD-10-CM

## 2015-02-02 DIAGNOSIS — O99012 Anemia complicating pregnancy, second trimester: Secondary | ICD-10-CM | POA: Insufficient documentation

## 2015-02-02 DIAGNOSIS — O26899 Other specified pregnancy related conditions, unspecified trimester: Secondary | ICD-10-CM | POA: Insufficient documentation

## 2015-02-02 LAB — RPR

## 2015-02-02 MED ORDER — FERROUS SULFATE 325 (65 FE) MG PO TBEC: 325.0000 mg | DELAYED_RELEASE_TABLET | Freq: Two times a day (BID) | ORAL | Status: AC

## 2015-02-02 NOTE — Telephone Encounter (Signed)
Per Sharen CounterLisa Leftwich-Kirby, CNM attempted to call patient. She is anemic with hgb 9.0 and was symptomatic this week in clinic. She will need to start iron 325mg  bid. Prescription sent in to pharmacy. She is also rh negative and will need to receive rhogam at her next clinic appointment on 02/14/15. A female answered the phone and stated that the patient was not at home. Left message stating that I was calling with non urgent test results and prescription information. Asked that patient please return our call at the clinic either before noon today or on Monday. Understanding was voiced.

## 2015-02-05 NOTE — Telephone Encounter (Signed)
Left message to call back regarding lab results.

## 2015-02-05 NOTE — Telephone Encounter (Signed)
Called pt and informed her that she is anemic and that the provider has prescribed iron tablet to be taken twice a day to bring up her levels.  Pt stated understanding with no further questions.

## 2015-02-14 ENCOUNTER — Encounter: Payer: Self-pay | Admitting: Certified Nurse Midwife

## 2015-03-27 ENCOUNTER — Ambulatory Visit (INDEPENDENT_AMBULATORY_CARE_PROVIDER_SITE_OTHER): Payer: Self-pay | Admitting: Certified Nurse Midwife

## 2015-03-27 VITALS — BP 135/75 | HR 95 | Temp 98.2°F | Wt 126.0 lb

## 2015-03-27 DIAGNOSIS — O360131 Maternal care for anti-D [Rh] antibodies, third trimester, fetus 1: Secondary | ICD-10-CM

## 2015-03-27 DIAGNOSIS — O360130 Maternal care for anti-D [Rh] antibodies, third trimester, not applicable or unspecified: Secondary | ICD-10-CM

## 2015-03-27 DIAGNOSIS — Z3492 Encounter for supervision of normal pregnancy, unspecified, second trimester: Secondary | ICD-10-CM

## 2015-03-27 DIAGNOSIS — O99013 Anemia complicating pregnancy, third trimester: Secondary | ICD-10-CM

## 2015-03-27 DIAGNOSIS — D649 Anemia, unspecified: Secondary | ICD-10-CM

## 2015-03-27 LAB — POCT URINALYSIS DIP (DEVICE)
Bilirubin Urine: NEGATIVE
Glucose, UA: NEGATIVE mg/dL
Hgb urine dipstick: NEGATIVE
Ketones, ur: 15 mg/dL — AB
Nitrite: NEGATIVE
Protein, ur: NEGATIVE mg/dL
Specific Gravity, Urine: 1.02 (ref 1.005–1.030)
Urobilinogen, UA: 0.2 mg/dL (ref 0.0–1.0)
pH: 6.5 (ref 5.0–8.0)

## 2015-03-27 MED ORDER — RHO D IMMUNE GLOBULIN 1500 UNIT/2ML IJ SOSY
300.0000 ug | PREFILLED_SYRINGE | Freq: Once | INTRAMUSCULAR | Status: AC
  Administered 2015-03-27: 300 ug via INTRAMUSCULAR

## 2015-03-27 NOTE — Patient Instructions (Signed)
(Group B Streptococcus Infection During Pregnancy) El estreptococo del grupo B (GBS, por sus siglas en ingls) es un tipo de bacteria que se encuentra en las mujeres sanas. No es el mismo que la bacteria que causa faringitis estreptoccica. Puede tener estreptococo del grupoB en la vagina, el recto o la vejiga. El estreptococo del grupoB no se transmite por contacto sexual; sin embargo, un beb puede contagiarse durante el parto, lo que puede ser peligroso para el nio. El estreptococo del grupoB no es peligroso para usted y, generalmente, no provoca sntomas. El mdico puede hacerle anlisis de deteccin del estreptococo del grupoB entre la semana 35 y la 37de embarazo. Esta bacteria es peligrosa solamente durante el parto, de modo que no es necesario hacer pruebas de deteccin con anterioridad. Es posible tener estreptococo del grupoB durante el embarazo y no transmitrselo nunca al beb. Si los resultados de los anlisis de deteccin del estreptococo del grupoB son positivos, el mdico puede recomendar que se le administren antibiticos durante el parto para asegurarse de que el beb est sano. FACTORES DE RIESGO Tiene ms probabilidades de transmitirle el estreptococo del grupoB al beb si:   Se le rompe la bolsa de aguas (ruptura de membranas) o si el trabajo de parto comienza antes de las 37semanas.  Se le rompe la bolsa 18horas antes del parto.  Transmiti el estreptococo del grupoB en un embarazo anterior.  Tiene una infeccin de las vas urinarias causada por el estreptococo del grupoB en cualquier momento durante el embarazo.  Tiene fiebre durante el trabajo de parto. SNTOMAS La mayora de las mujeres que tienen estreptococo del grupoB no presentan sntomas. Si tiene una infeccin de las vas urinarias causada por el estreptococo del grupoB, es posible que orine con frecuencia o que tenga dolor al orinar y fiebre. Generalmente, los bebs que tienen estreptococo del grupoB  presentan sntomas en el trmino de los 7das posteriores al nacimiento. Los sntomas pueden incluir:   Problemas respiratorios.  Problemas cardacos y de presin arterial.  Problemas digestivos y renales. DIAGNSTICO Se recomienda que todas las embarazadas se hagan exmenes de rutina para la deteccin del estreptococo del grupoB. El mdico toma una muestra de secrecin de la vagina y el recto con un hisopo, que luego se enva al laboratorio para detectar la presencia de estreptococo del grupoB. Tambin pueden tomarle una muestra de orina para controlar si hay bacterias.  TRATAMIENTO Si el resultado de los anlisis de deteccin del estreptococo del grupoB es positivo, tal vez deba recibir tratamiento con un antibitico durante el trabajo de parto. En cuanto comience el trabajo de parto o se produzca la ruptura de las membranas, recibir el antibitico a travs de una va intravenosa. Seguir recibiendo este medicamento hasta despus del parto. No necesita antibiticos si el parto es por cesrea. Si el beb muestra signos o sntomas de estreptococo del grupoB despus del parto, tambin puede recibir tratamiento con antibiticos. INSTRUCCIONES PARA EL CUIDADO EN EL HOGAR   Tome todos los medicamentos tal como el mdico le recet. Tome los medicamentos solamente como se lo hayan indicado.  Contine con las visitas y cuidados prenatales.  Cumpla con todas las visitas de control. SOLICITE ATENCIN MDICA SI:   Siente dolor al orinar.  Tiene necesidad de orinar con frecuencia.  Tiene fiebre. SOLICITE ATENCIN MDICA DE INMEDIATO SI:   Se produce la ruptura de las membranas.  Comienza el trabajo de parto.   Esta informacin no tiene como fin reemplazar el consejo del mdico. Asegrese   de hacerle al mdico cualquier pregunta que tenga.   Document Released: 06/25/2007 Document Revised: 01/11/2013 Elsevier Interactive Patient Education 2016 [employer] Inc.  

## 2015-03-27 NOTE — Progress Notes (Signed)
Subjective:  Rhonda Mccarthy is a 25 y.o. G2P1001 at 2757w1d being seen today for ongoing prenatal care.  She is currently monitored for the following issues for this low-risk pregnancy and has Supervision of normal pregnancy in second trimester; Anemia affecting pregnancy in second trimester; and Rh negative status during pregnancy on her problem list.  Patient reports no complaints.  Contractions: Irregular. Vag. Bleeding: None.  Movement: Present. Denies leaking of fluid.   The following portions of the patient's history were reviewed and updated as appropriate: allergies, current medications, past family history, past medical history, past social history, past surgical history and problem list. Problem list updated.  Objective:   Filed Vitals:   03/27/15 1433  BP: 135/75  Pulse: 95  Temp: 98.2 F (36.8 C)  Weight: 126 lb (57.153 kg)    Fetal Status: Fetal Heart Rate (bpm): 140   Movement: Present     General:  Alert, oriented and cooperative. Patient is in no acute distress.  Skin: Skin is warm and dry. No rash noted.   Cardiovascular: Normal heart rate noted  Respiratory: Normal respiratory effort, no problems with respiration noted  Abdomen: Soft, gravid, appropriate for gestational age. Pain/Pressure: Present     Pelvic: Vag. Bleeding: None     Cervical exam deferred        Extremities: Normal range of motion.  Edema: None  Mental Status: Normal mood and affect. Normal behavior. Normal judgment and thought content.   Urinalysis:      Assessment and Plan:  Pregnancy: G2P1001 at 2757w1d  1. Rh negative status during pregnancy, third trimester, fetus 1  - rho (d) immune globulin (RHIG/RHOPHYLAC) injection 300 mcg; Inject 2 mLs (300 mcg total) into the muscle once.  2. Supervision of normal pregnancy in second trimester   Preterm labor symptoms and general obstetric precautions including but not limited to vaginal bleeding, contractions, leaking of fluid and fetal movement  were reviewed in detail with the patient. Please refer to After Visit Summary for other counseling recommendations.  Return in about 1 week (around 04/03/2015).   Rhea PinkLori A Clemmons, CNM

## 2015-04-03 ENCOUNTER — Other Ambulatory Visit (HOSPITAL_COMMUNITY)
Admission: RE | Admit: 2015-04-03 | Discharge: 2015-04-03 | Disposition: A | Discharge status: Home / Self Care | Payer: Medicaid Other | Source: Ambulatory Visit | Attending: Advanced Practice Midwife | Admitting: Advanced Practice Midwife

## 2015-04-03 ENCOUNTER — Encounter: Payer: Self-pay | Admitting: Advanced Practice Midwife

## 2015-04-03 ENCOUNTER — Ambulatory Visit (INDEPENDENT_AMBULATORY_CARE_PROVIDER_SITE_OTHER): Payer: Medicaid Other | Admitting: Advanced Practice Midwife

## 2015-04-03 VITALS — BP 131/65 | HR 106 | Temp 98.7°F | Wt 128.6 lb

## 2015-04-03 DIAGNOSIS — Z3483 Encounter for supervision of other normal pregnancy, third trimester: Secondary | ICD-10-CM

## 2015-04-03 DIAGNOSIS — Z113 Encounter for screening for infections with a predominantly sexual mode of transmission: Secondary | ICD-10-CM | POA: Diagnosis not present

## 2015-04-03 DIAGNOSIS — Z3493 Encounter for supervision of normal pregnancy, unspecified, third trimester: Secondary | ICD-10-CM

## 2015-04-03 DIAGNOSIS — R42 Dizziness and giddiness: Secondary | ICD-10-CM | POA: Insufficient documentation

## 2015-04-03 LAB — OB RESULTS CONSOLE GC/CHLAMYDIA: GC PROBE AMP, GENITAL: NEGATIVE

## 2015-04-03 LAB — POCT URINALYSIS DIP (DEVICE)
BILIRUBIN URINE: NEGATIVE
GLUCOSE, UA: NEGATIVE mg/dL
Hgb urine dipstick: NEGATIVE
Ketones, ur: NEGATIVE mg/dL
NITRITE: NEGATIVE
Protein, ur: 30 mg/dL — AB
UROBILINOGEN UA: 0.2 mg/dL (ref 0.0–1.0)
pH: 6 (ref 5.0–8.0)

## 2015-04-03 LAB — OB RESULTS CONSOLE GBS: GBS: NEGATIVE

## 2015-04-03 NOTE — Patient Instructions (Signed)

## 2015-04-03 NOTE — Progress Notes (Signed)
Subjective:  Rhonda Mccarthy is a 25 y.o. G2P1001 at 4165w1d being seen today for ongoing prenatal care.  She is currently monitored for the following issues for this low-risk pregnancy and has Supervision of normal pregnancy in second trimester; Anemia affecting pregnancy in second trimester; Rh negative status during pregnancy; and Dizziness on her problem list.  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   Thinks it is because of anemia and hard work load. Wants to be written out on disability.     Contractions: Not present. Vag. Bleeding: None.  Movement: Present. Denies leaking of fluid.   The following portions of the patient's history were reviewed and updated as appropriate: allergies, current medications, past family history, past medical history, past social history, past surgical history and problem list. Problem list updated.  Objective:   Filed Vitals:   04/03/15 1419  BP: 131/65  Pulse: 106  Temp: 98.7 F (37.1 C)  Weight: 128 lb 9.6 oz (58.333 kg)    Fetal Status: Fetal Heart Rate (bpm): 156   Movement: Present     General:  Alert, oriented and cooperative. Patient is in no acute distress.  Skin: Skin is warm and dry. No rash noted.   Cardiovascular: Normal heart rate noted  Respiratory: Normal respiratory effort, no problems with respiration noted  Abdomen: Soft, gravid, appropriate for gestational age. Pain/Pressure: Present     Pelvic: Vag. Bleeding: None     Cervical exam performed        Extremities: Normal range of motion.     Mental Status: Normal mood and affect. Normal behavior. Normal judgment and thought content.   Urinalysis:      Assessment and Plan:  Pregnancy: G2P1001 at 6965w1d  1. Supervision of normal pregnancy in third trimester      GBS and cultures done - Culture, beta strep (group b only) - GC/Chlamydia probe amp ([place]  2. Dizziness      Vital signs stable       Discussed  disability can be challenging, depending on requirements of the company.  Discussed getting lighter workload.  Patient states there are no lighter duty jobs.  I provided a letter for no lifting over 20 lbs and frequent water and bathroom breaks.  She will contact her HR for details and paperwork for disability.  Did not promise anything.  May not be able to adequately prove disability.       Recommend good hydration and lighter lifting       Continue iron supplements for anemia  Preterm labor symptoms and general obstetric precautions including but not limited to vaginal bleeding, contractions, leaking of fluid and fetal movement were reviewed in detail with the patient. Please refer to After Visit Summary for other counseling recommendations.  Return in about 1 week (around 04/10/2015) for [facility].   Aviva SignsMarie L Everrett Lacasse, CNM

## 2015-04-04 LAB — GC/CHLAMYDIA PROBE AMP (~~LOC~~) NOT AT ARMC
Chlamydia: NEGATIVE
NEISSERIA GONORRHEA: NEGATIVE

## 2015-04-05 LAB — CULTURE, BETA STREP (GROUP B ONLY)

## 2015-04-10 ENCOUNTER — Encounter: Payer: Medicaid Other | Admitting: Advanced Practice Midwife

## 2015-04-18 ENCOUNTER — Telehealth: Payer: Self-pay

## 2015-04-18 ENCOUNTER — Encounter: Payer: Medicaid Other | Admitting: Advanced Practice Midwife

## 2015-04-18 NOTE — Telephone Encounter (Signed)
Pt has upcoming appt this week.

## 2015-04-18 NOTE — Telephone Encounter (Signed)
I have left a message for patient to call us regarding her short term-disability form she drop off. Per Tresa EndoKelly we cannot fill this form out because the doctor has not taken her out of work. She can take short term disability without pay.

## 2015-04-19 ENCOUNTER — Ambulatory Visit (INDEPENDENT_AMBULATORY_CARE_PROVIDER_SITE_OTHER): Payer: Medicaid Other | Admitting: Obstetrics and Gynecology

## 2015-04-19 VITALS — BP 131/68 | HR 91 | Wt 129.4 lb

## 2015-04-19 DIAGNOSIS — O360190 Maternal care for anti-D [Rh] antibodies, unspecified trimester, not applicable or unspecified: Secondary | ICD-10-CM

## 2015-04-19 DIAGNOSIS — Z3483 Encounter for supervision of other normal pregnancy, third trimester: Secondary | ICD-10-CM | POA: Diagnosis not present

## 2015-04-19 DIAGNOSIS — O99013 Anemia complicating pregnancy, third trimester: Secondary | ICD-10-CM | POA: Diagnosis not present

## 2015-04-19 DIAGNOSIS — O3663X0 Maternal care for excessive fetal growth, third trimester, not applicable or unspecified: Secondary | ICD-10-CM

## 2015-04-19 DIAGNOSIS — D649 Anemia, unspecified: Secondary | ICD-10-CM | POA: Diagnosis not present

## 2015-04-19 DIAGNOSIS — Z3492 Encounter for supervision of normal pregnancy, unspecified, second trimester: Secondary | ICD-10-CM

## 2015-04-19 LAB — POCT URINALYSIS DIP (DEVICE)
GLUCOSE, UA: NEGATIVE mg/dL
Hgb urine dipstick: NEGATIVE
NITRITE: NEGATIVE
PROTEIN: 100 mg/dL — AB
Specific Gravity, Urine: >=1.03 (ref 1.005–1.030)
UROBILINOGEN UA: 0.2 mg/dL (ref 0.0–1.0)
pH: 6 (ref 5.0–8.0)

## 2015-04-19 LAB — CBC
HCT: 29.4 % — ABNORMAL LOW (ref 36.0–46.0)
Hemoglobin: 9.1 g/dL — ABNORMAL LOW (ref 12.0–15.0)
MCH: 23.2 pg — ABNORMAL LOW (ref 26.0–34.0)
MCHC: 31 g/dL (ref 30.0–36.0)
MCV: 75 fL — ABNORMAL LOW (ref 78.0–100.0)
MPV: 11.3 fL (ref 8.6–12.4)
PLATELETS: 206 10*3/uL (ref 150–400)
RBC: 3.92 MIL/uL (ref 3.87–5.11)
RDW: 17.4 % — AB (ref 11.5–15.5)
WBC: 9 10*3/uL (ref 4.0–10.5)

## 2015-04-19 NOTE — Progress Notes (Signed)
Subjective:  Rhonda Mccarthy is a 25 y.o. G2P1001 at 1063w3d being seen today for ongoing prenatal care.  She is currently monitored for the following issues for this low-risk pregnancy and has Supervision of normal pregnancy in second trimester; Anemia affecting pregnancy in second trimester; Rh negative status during pregnancy; and Dizziness on her problem list.  Patient reports no complaints.  Contractions: Not present. Vag. Bleeding: None.  Movement: Present. Denies leaking of fluid.   The following portions of the patient's history were reviewed and updated as appropriate: allergies, current medications, past family history, past medical history, past social history, past surgical history and problem list. Problem list updated.  Objective:   Filed Vitals:   04/19/15 1137  BP: 131/68  Pulse: 91  Weight: 129 lb 6.4 oz (58.695 kg)    Fetal Status: Fetal Heart Rate (bpm): 150   Movement: Present     General:  Alert, oriented and cooperative. Patient is in no acute distress.  Skin: Skin is warm and dry. No rash noted.   Cardiovascular: Normal heart rate noted  Respiratory: Normal respiratory effort, no problems with respiration noted  Abdomen: Soft, gravid, appropriate for gestational age. Pain/Pressure: Absent     Pelvic: Vag. Bleeding: None     Cervical exam deferred        Extremities: Normal range of motion.  Edema: None  Mental Status: Normal mood and affect. Normal behavior. Normal judgment and thought content.   Urinalysis:      Assessment and Plan:  Pregnancy: G2P1001 at 1063w3d  # size greater than dates - u/s ordered  # Anemia - repeat CBC; compliant with bid feso4  Preterm labor symptoms and general obstetric precautions including but not limited to vaginal bleeding, contractions, leaking of fluid and fetal movement were reviewed in detail with the patient. Please refer to After Visit Summary for other counseling recommendations.  F/u 1 week  Kathrynn RunningNoah Bedford Wilfred Dayrit,  MD

## 2015-04-19 NOTE — Progress Notes (Signed)
Pt reports that she passed out at work 2 months ago. She has not been back to work since that time. She has dizziness and says that she can't walk very well.

## 2015-04-20 ENCOUNTER — Ambulatory Visit (HOSPITAL_COMMUNITY)
Admission: RE | Admit: 2015-04-20 | Discharge: 2015-04-20 | Disposition: A | Discharge status: Home / Self Care | Payer: Medicaid Other | Source: Ambulatory Visit | Attending: Obstetrics and Gynecology | Admitting: Obstetrics and Gynecology

## 2015-04-20 ENCOUNTER — Other Ambulatory Visit: Payer: Self-pay | Admitting: Obstetrics and Gynecology

## 2015-04-20 DIAGNOSIS — O360190 Maternal care for anti-D [Rh] antibodies, unspecified trimester, not applicable or unspecified: Secondary | ICD-10-CM

## 2015-04-20 DIAGNOSIS — Z3A38 38 weeks gestation of pregnancy: Secondary | ICD-10-CM | POA: Diagnosis not present

## 2015-04-20 DIAGNOSIS — Z3492 Encounter for supervision of normal pregnancy, unspecified, second trimester: Secondary | ICD-10-CM

## 2015-04-20 DIAGNOSIS — O26843 Uterine size-date discrepancy, third trimester: Secondary | ICD-10-CM | POA: Insufficient documentation

## 2015-04-20 IMAGING — US US MFM OB FOLLOW-UP
1 series · 14 of 23 positions shown · non-contrast
Comparison: none

[Series 1: us mfm ob follow-up · 23 acquisitions, 14 frames shown]
[im 1/23]
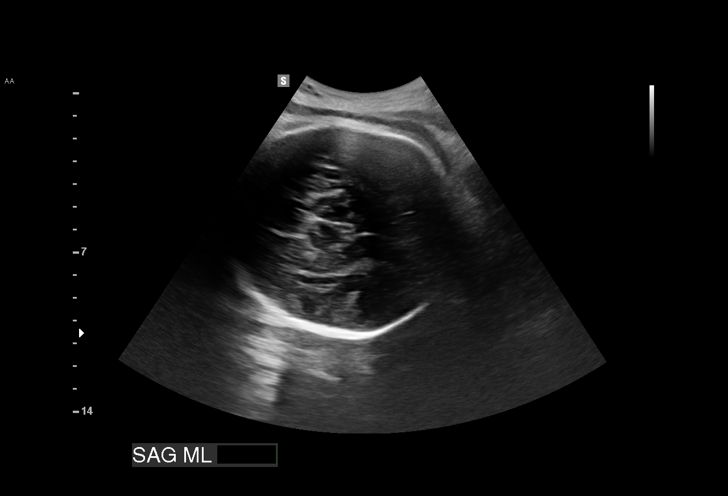
[im 3/23]
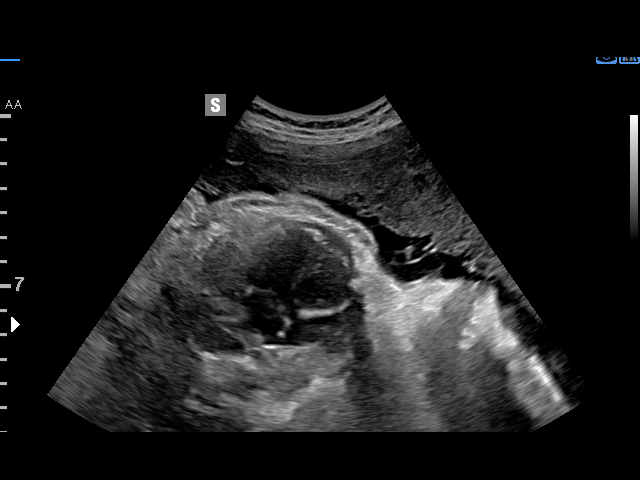
[im 5/23]
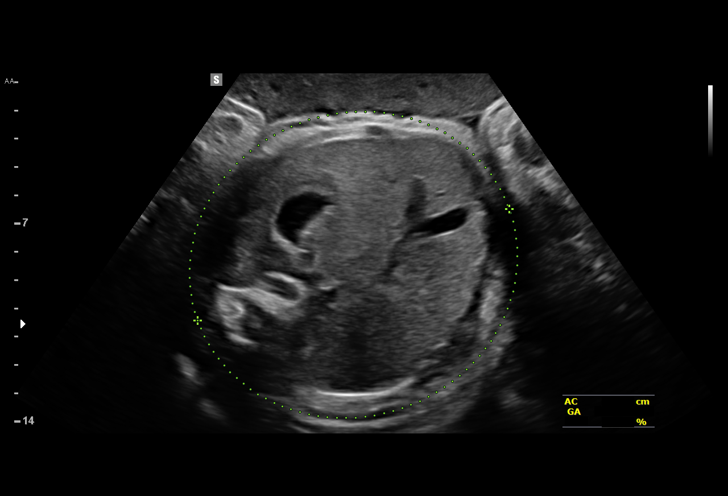
[im 6/23]
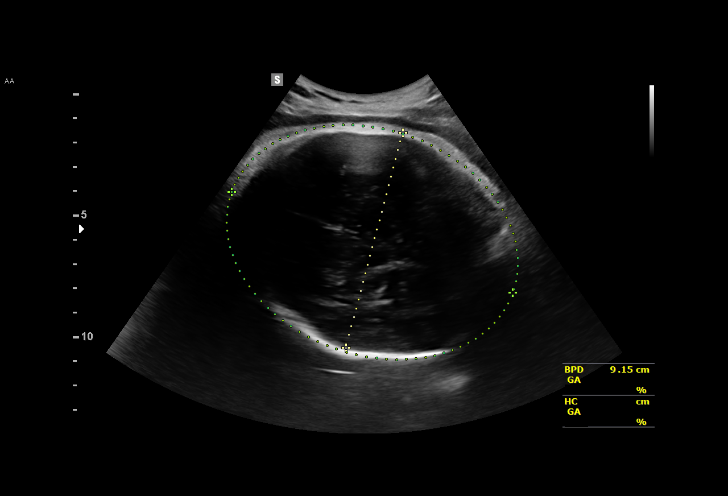
[im 8/23]
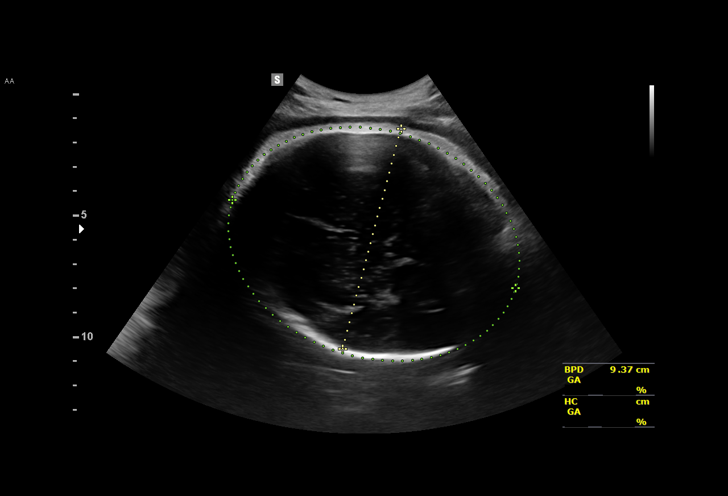
[im 10/23]
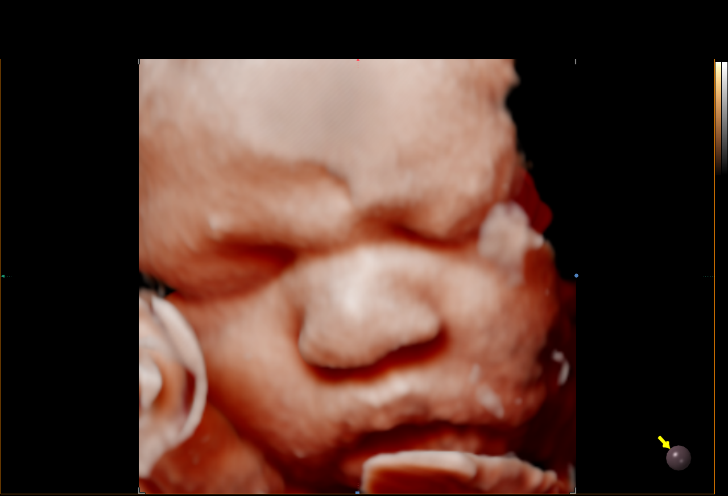
[im 11/23]
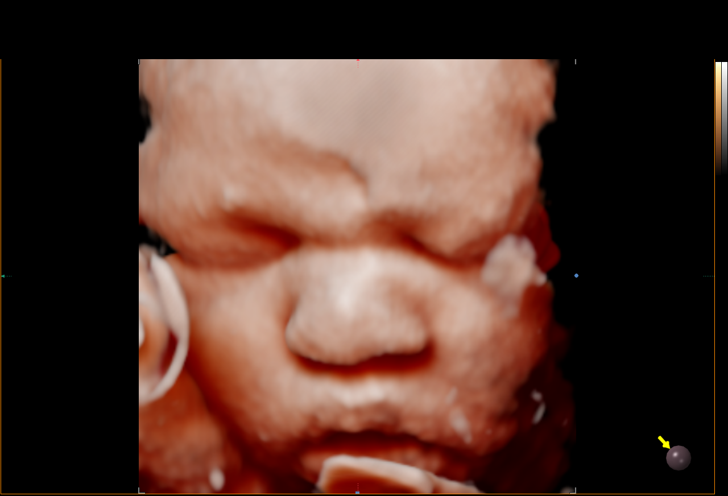
[im 13/23]
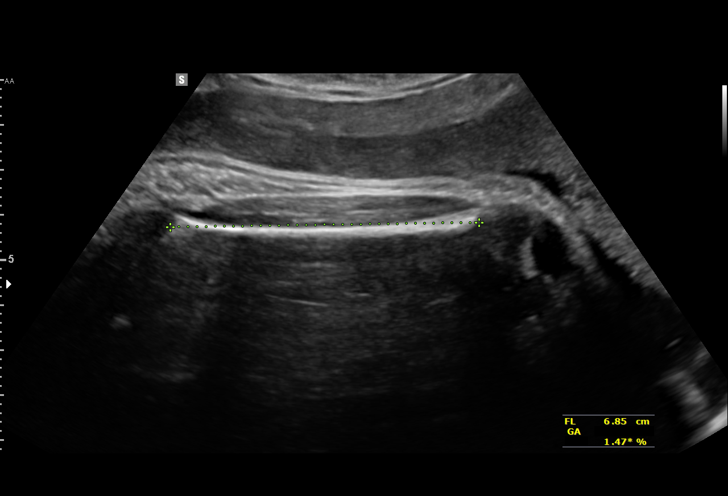
[im 14/23]
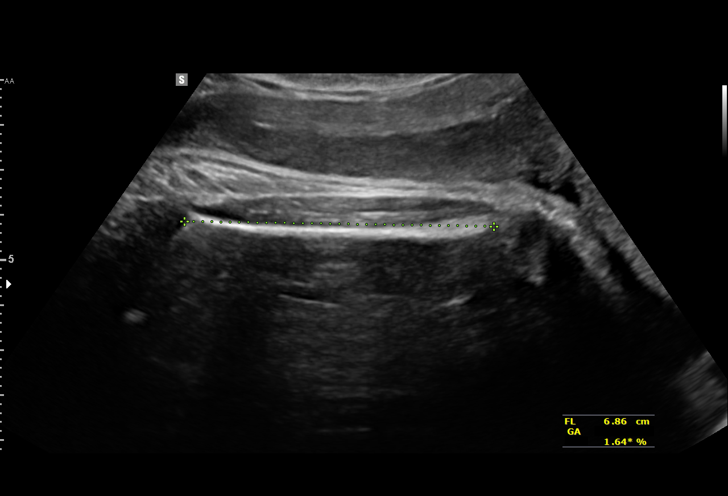
[im 16/23]
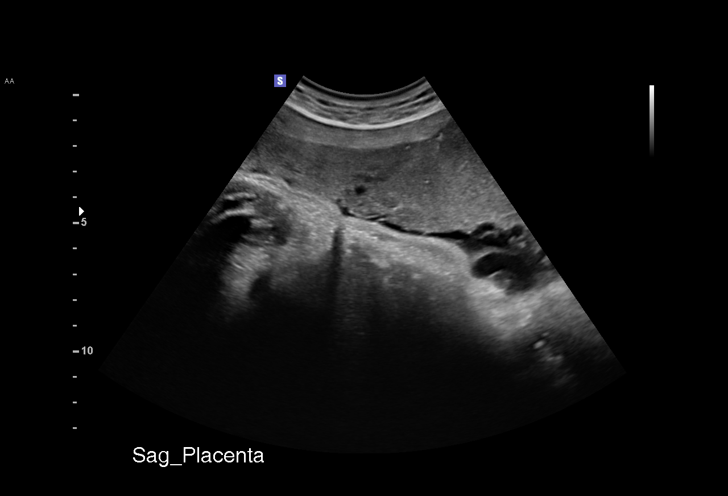
[im 18/23]
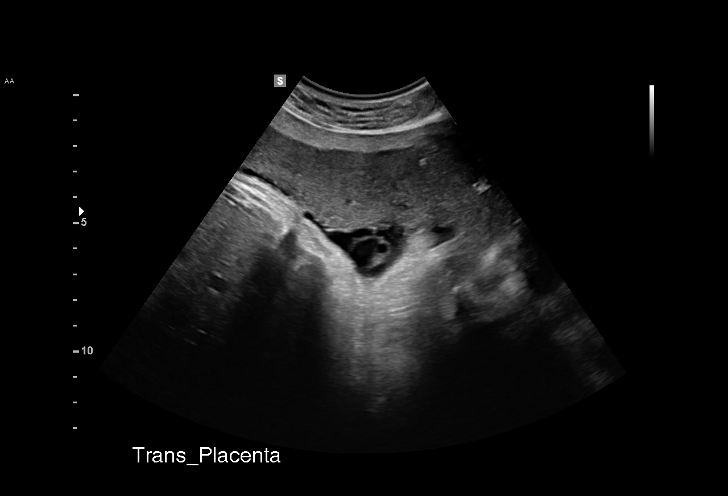
[im 19/23]
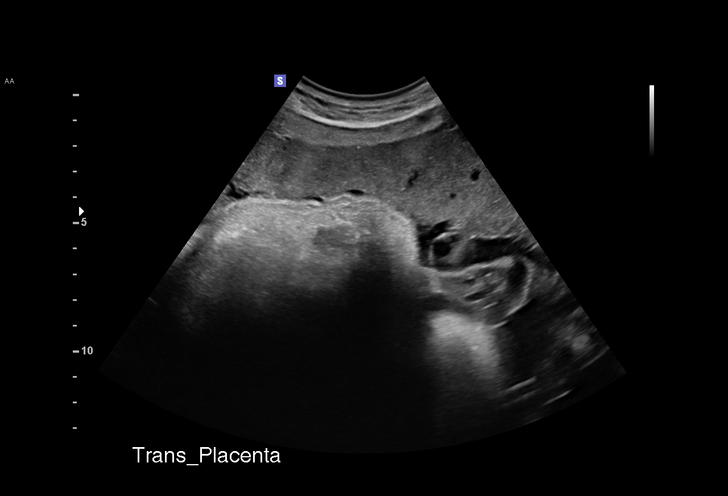
[im 21/23]
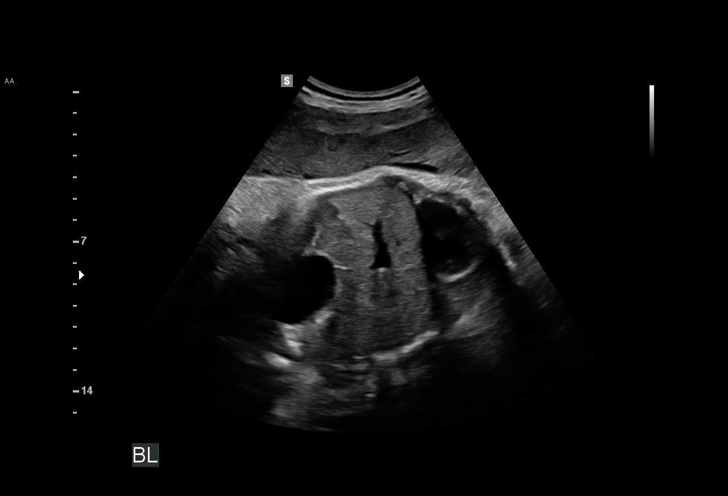
[im 23/23]
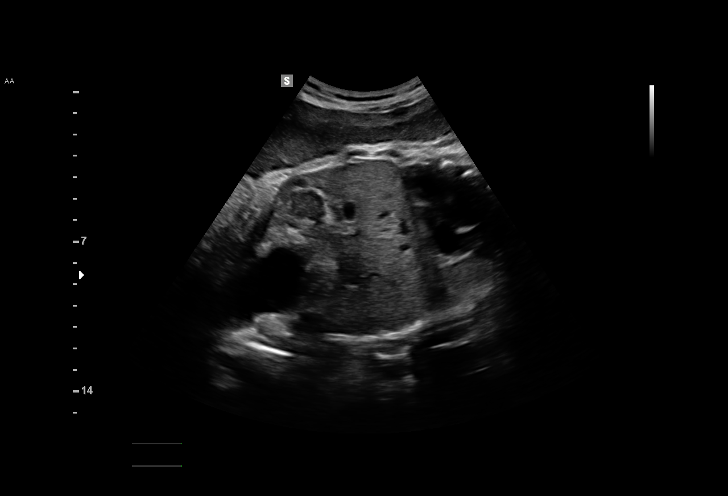

[14 of 23 positions shown; findings below may reference images not displayed]

Hospital Clinic-
Faculty Physician
OB/Gyn Clinic
[REDACTED]

1  LUE                [PHONE_NUMBER]      [PHONE_NUMBER]     [PHONE_NUMBER]
Indications

38 weeks gestation of pregnancy
Uterine size-date discrepancy, third trimester [SD]
OB History

Gravidity:    2         Term:   1        Prem:   0        SAB:   0
TOP:          0       Ectopic:  0        Living: 1
Fetal Evaluation

Num Of Fetuses:     1
Fetal Heart         141
Rate(bpm):
Cardiac Activity:   Observed
Presentation:       Cephalic
Placenta:           Anterior, above cervical os

Amniotic Fluid
AFI FV:      Subjectively within normal limits
AFI Sum:     12.02   cm       43  %Tile     Larg Pckt:    6.27  cm
RUQ:   0       cm   RLQ:    5.11   cm    LUQ:   6.27    cm   LLQ:    0.64   cm
Biometry

BPD:      92.8  mm     G. Age:  37w 5d                  CI:        74.49   %    70 - 86
FL/HC:      20.1   %    20.6 -
HC:      341.3  mm     G. Age:  39w 2d         52  %    HC/AC:      0.99        0.87 -
AC:      346.3  mm     G. Age:  38w 4d         69  %    FL/BPD:     73.9   %    71 - 87
FL:       68.6  mm     G. Age:  35w 1d        < 3  %    FL/AC:      19.8   %    20 - 24

Est. FW:    [SD]  gm      7 lb 5 oz     66  %
Gestational Age

LMP:           38w 4d        Date:  [DATE]                 EDD:   [DATE]
U/S Today:     37w 5d                                        EDD:   [DATE]
Best:          38w 4d     Det. By:  LMP  ([DATE])          EDD:   [DATE]
Anatomy

Cranium:          Appears normal         Aortic Arch:      Previously seen
Fetal Cavum:      Previously seen        Ductal Arch:      Previously seen
Ventricles:       Previously seen        Diaphragm:        Appears normal
Choroid Plexus:   Previously seen        Stomach:          Appears normal, left
sided
Cerebellum:       Previously seen        Abdomen:          Previously seen
Posterior Fossa:  Previously seen        Abdominal Wall:   Previously seen
Nuchal Fold:      Previously seen        Cord Vessels:     Previously seen
Face:             Orbits and profile     Kidneys:          Appear normal
previously seen
Lips:             Previously seen        Bladder:          Appears normal
Heart:            Previously seen        Spine:            Limited views
previously seen
RVOT:             Previously seen        Upper             Previously seen
Extremities:
LVOT:             Previously seen        Lower             Previously seen
Extremities:

Other:  Technically difficult due to advanced GA and fetal position.
Impression

Single IUP at 38w 4d
size/ dates discrepancy
The estimated fetal weight today is at the 66th %tile.
Anterior placenta without previa
Normal amniotic fluid volume
Recommendations

Follow up ultrasounds as clinically indicated

## 2015-04-22 ENCOUNTER — Inpatient Hospital Stay (HOSPITAL_COMMUNITY)
Admission: AD | Admit: 2015-04-22 | Discharge: 2015-04-24 | DRG: 775 | Disposition: A | Discharge status: Home / Self Care | Payer: Medicaid Other | Source: Ambulatory Visit | Attending: Obstetrics & Gynecology | Admitting: Obstetrics & Gynecology

## 2015-04-22 ENCOUNTER — Inpatient Hospital Stay (HOSPITAL_COMMUNITY): Payer: Medicaid Other | Admitting: Anesthesiology

## 2015-04-22 ENCOUNTER — Encounter (HOSPITAL_COMMUNITY): Payer: Self-pay | Admitting: *Deleted

## 2015-04-22 DIAGNOSIS — R42 Dizziness and giddiness: Secondary | ICD-10-CM

## 2015-04-22 DIAGNOSIS — O26899 Other specified pregnancy related conditions, unspecified trimester: Secondary | ICD-10-CM | POA: Diagnosis present

## 2015-04-22 DIAGNOSIS — O4290 Premature rupture of membranes, unspecified as to length of time between rupture and onset of labor, unspecified weeks of gestation: Secondary | ICD-10-CM

## 2015-04-22 DIAGNOSIS — D649 Anemia, unspecified: Secondary | ICD-10-CM | POA: Diagnosis not present

## 2015-04-22 DIAGNOSIS — O26893 Other specified pregnancy related conditions, third trimester: Secondary | ICD-10-CM | POA: Diagnosis present

## 2015-04-22 DIAGNOSIS — O360930 Maternal care for other rhesus isoimmunization, third trimester, not applicable or unspecified: Secondary | ICD-10-CM | POA: Diagnosis not present

## 2015-04-22 DIAGNOSIS — Z6791 Unspecified blood type, Rh negative: Secondary | ICD-10-CM | POA: Diagnosis not present

## 2015-04-22 DIAGNOSIS — O9902 Anemia complicating childbirth: Secondary | ICD-10-CM | POA: Diagnosis not present

## 2015-04-22 DIAGNOSIS — O99012 Anemia complicating pregnancy, second trimester: Secondary | ICD-10-CM

## 2015-04-22 DIAGNOSIS — O360190 Maternal care for anti-D [Rh] antibodies, unspecified trimester, not applicable or unspecified: Secondary | ICD-10-CM

## 2015-04-22 DIAGNOSIS — O4202 Full-term premature rupture of membranes, onset of labor within 24 hours of rupture: Secondary | ICD-10-CM | POA: Diagnosis present

## 2015-04-22 DIAGNOSIS — Z3492 Encounter for supervision of normal pregnancy, unspecified, second trimester: Secondary | ICD-10-CM

## 2015-04-22 DIAGNOSIS — Z3A38 38 weeks gestation of pregnancy: Secondary | ICD-10-CM

## 2015-04-22 LAB — CBC
HCT: 28.4 % — ABNORMAL LOW (ref 36.0–46.0)
HEMOGLOBIN: 8.7 g/dL — AB (ref 12.0–15.0)
MCH: 23.3 pg — AB (ref 26.0–34.0)
MCHC: 30.6 g/dL (ref 30.0–36.0)
MCV: 75.9 fL — ABNORMAL LOW (ref 78.0–100.0)
Platelets: 202 10*3/uL (ref 150–400)
RBC: 3.74 MIL/uL — ABNORMAL LOW (ref 3.87–5.11)
RDW: 17.6 % — ABNORMAL HIGH (ref 11.5–15.5)
WBC: 13.2 10*3/uL — ABNORMAL HIGH (ref 4.0–10.5)

## 2015-04-22 LAB — RPR: RPR: NONREACTIVE

## 2015-04-22 LAB — POCT FERN TEST: POCT Fern Test: POSITIVE

## 2015-04-22 MED ORDER — PRENATAL MULTIVITAMIN CH
1.0000 | ORAL_TABLET | Freq: Every day | ORAL | Status: DC
  Administered 2015-04-23 – 2015-04-24 (×2): 1 via ORAL
  Filled 2015-04-22 (×2): qty 1

## 2015-04-22 MED ORDER — ONDANSETRON HCL 4 MG/2ML IJ SOLN: 4.0000 mg | INTRAMUSCULAR | Status: DC | PRN

## 2015-04-22 MED ORDER — DIPHENHYDRAMINE HCL 50 MG/ML IJ SOLN: 12.5000 mg | INTRAMUSCULAR | Status: DC | PRN

## 2015-04-22 MED ORDER — TETANUS-DIPHTH-ACELL PERTUSSIS 5-2.5-18.5 LF-MCG/0.5 IM SUSP: 0.5000 mL | Freq: Once | INTRAMUSCULAR | Status: DC

## 2015-04-22 MED ORDER — LIDOCAINE HCL (PF) 1 % IJ SOLN
30.0000 mL | INTRAMUSCULAR | Status: DC | PRN
  Filled 2015-04-22: qty 30

## 2015-04-22 MED ORDER — WITCH HAZEL-GLYCERIN EX PADS: 1.0000 "application " | MEDICATED_PAD | CUTANEOUS | Status: DC | PRN

## 2015-04-22 MED ORDER — FENTANYL 2.5 MCG/ML BUPIVACAINE 1/10 % EPIDURAL INFUSION (WH - ANES)
14.0000 mL/h | INTRAMUSCULAR | Status: DC | PRN
  Administered 2015-04-22: 14 mL/h via EPIDURAL
  Filled 2015-04-22: qty 125

## 2015-04-22 MED ORDER — TERBUTALINE SULFATE 1 MG/ML IJ SOLN: 0.2500 mg | Freq: Once | INTRAMUSCULAR | Status: DC | PRN

## 2015-04-22 MED ORDER — DIBUCAINE 1 % RE OINT: 1.0000 "application " | TOPICAL_OINTMENT | RECTAL | Status: DC | PRN

## 2015-04-22 MED ORDER — LACTATED RINGERS IV SOLN
500.0000 mL | INTRAVENOUS | Status: DC | PRN
  Administered 2015-04-22: 500 mL via INTRAVENOUS

## 2015-04-22 MED ORDER — LANOLIN HYDROUS EX OINT: TOPICAL_OINTMENT | CUTANEOUS | Status: DC | PRN

## 2015-04-22 MED ORDER — CITRIC ACID-SODIUM CITRATE 334-500 MG/5ML PO SOLN: 30.0000 mL | ORAL | Status: DC | PRN

## 2015-04-22 MED ORDER — DIPHENHYDRAMINE HCL 25 MG PO CAPS: 25.0000 mg | ORAL_CAPSULE | Freq: Four times a day (QID) | ORAL | Status: DC | PRN

## 2015-04-22 MED ORDER — OXYTOCIN 10 UNIT/ML IJ SOLN
1.0000 m[IU]/min | INTRAVENOUS | Status: DC
  Administered 2015-04-22: 2 m[IU]/min via INTRAVENOUS

## 2015-04-22 MED ORDER — EPHEDRINE 5 MG/ML INJ: 10.0000 mg | INTRAVENOUS | Status: DC | PRN

## 2015-04-22 MED ORDER — SODIUM CHLORIDE 0.9% FLUSH: 3.0000 mL | Freq: Two times a day (BID) | INTRAVENOUS | Status: DC

## 2015-04-22 MED ORDER — LACTATED RINGERS IV SOLN: 500.0000 mL | Freq: Once | INTRAVENOUS | Status: DC

## 2015-04-22 MED ORDER — SENNOSIDES-DOCUSATE SODIUM 8.6-50 MG PO TABS
2.0000 | ORAL_TABLET | ORAL | Status: DC
  Administered 2015-04-23 – 2015-04-24 (×2): 2 via ORAL
  Filled 2015-04-22 (×2): qty 2

## 2015-04-22 MED ORDER — ACETAMINOPHEN 325 MG PO TABS: 650.0000 mg | ORAL_TABLET | ORAL | Status: DC | PRN

## 2015-04-22 MED ORDER — LIDOCAINE HCL (PF) 1 % IJ SOLN
INTRAMUSCULAR | Status: DC | PRN
  Administered 2015-04-22 (×2): 4 mL

## 2015-04-22 MED ORDER — OXYTOCIN 10 UNIT/ML IJ SOLN
2.5000 [IU]/h | INTRAVENOUS | Status: DC
  Filled 2015-04-22 (×2): qty 4

## 2015-04-22 MED ORDER — FLEET ENEMA 7-19 GM/118ML RE ENEM: 1.0000 | ENEMA | RECTAL | Status: DC | PRN

## 2015-04-22 MED ORDER — ONDANSETRON HCL 4 MG PO TABS: 4.0000 mg | ORAL_TABLET | ORAL | Status: DC | PRN

## 2015-04-22 MED ORDER — SIMETHICONE 80 MG PO CHEW: 80.0000 mg | CHEWABLE_TABLET | ORAL | Status: DC | PRN

## 2015-04-22 MED ORDER — OXYTOCIN BOLUS FROM INFUSION: 500.0000 mL | INTRAVENOUS | Status: DC

## 2015-04-22 MED ORDER — LACTATED RINGERS IV SOLN
INTRAVENOUS | Status: DC
  Administered 2015-04-22 (×2): via INTRAVENOUS

## 2015-04-22 MED ORDER — FENTANYL CITRATE (PF) 100 MCG/2ML IJ SOLN
100.0000 ug | INTRAMUSCULAR | Status: DC | PRN
  Administered 2015-04-22: 100 ug via INTRAVENOUS
  Filled 2015-04-22: qty 2

## 2015-04-22 MED ORDER — IBUPROFEN 600 MG PO TABS
600.0000 mg | ORAL_TABLET | Freq: Four times a day (QID) | ORAL | Status: DC
  Administered 2015-04-22 – 2015-04-24 (×8): 600 mg via ORAL
  Filled 2015-04-22 (×7): qty 1

## 2015-04-22 MED ORDER — ONDANSETRON HCL 4 MG/2ML IJ SOLN: 4.0000 mg | Freq: Four times a day (QID) | INTRAMUSCULAR | Status: DC | PRN

## 2015-04-22 MED ORDER — OXYCODONE-ACETAMINOPHEN 5-325 MG PO TABS: 2.0000 | ORAL_TABLET | ORAL | Status: DC | PRN

## 2015-04-22 MED ORDER — MEASLES, MUMPS & RUBELLA VAC ~~LOC~~ INJ
0.5000 mL | INJECTION | Freq: Once | SUBCUTANEOUS | Status: DC
  Filled 2015-04-22: qty 0.5

## 2015-04-22 MED ORDER — OXYCODONE-ACETAMINOPHEN 5-325 MG PO TABS
1.0000 | ORAL_TABLET | ORAL | Status: DC | PRN
  Administered 2015-04-23: 1 via ORAL
  Filled 2015-04-22 (×2): qty 1

## 2015-04-22 MED ORDER — SODIUM CHLORIDE 0.9 % IV SOLN: 250.0000 mL | INTRAVENOUS | Status: DC | PRN

## 2015-04-22 MED ORDER — PHENYLEPHRINE 40 MCG/ML (10ML) SYRINGE FOR IV PUSH (FOR BLOOD PRESSURE SUPPORT)
80.0000 ug | PREFILLED_SYRINGE | INTRAVENOUS | Status: DC | PRN
  Filled 2015-04-22: qty 20

## 2015-04-22 MED ORDER — PHENYLEPHRINE 40 MCG/ML (10ML) SYRINGE FOR IV PUSH (FOR BLOOD PRESSURE SUPPORT): 80.0000 ug | PREFILLED_SYRINGE | INTRAVENOUS | Status: DC | PRN

## 2015-04-22 MED ORDER — OXYTOCIN 10 UNIT/ML IJ SOLN
INTRAMUSCULAR | Status: AC
  Administered 2015-04-22: 10 [IU]
  Filled 2015-04-22: qty 1

## 2015-04-22 MED ORDER — BENZOCAINE-MENTHOL 20-0.5 % EX AERO: 1.0000 "application " | INHALATION_SPRAY | CUTANEOUS | Status: DC | PRN

## 2015-04-22 MED ORDER — SODIUM CHLORIDE 0.9% FLUSH: 3.0000 mL | INTRAVENOUS | Status: DC | PRN

## 2015-04-22 MED ORDER — ZOLPIDEM TARTRATE 5 MG PO TABS: 5.0000 mg | ORAL_TABLET | Freq: Every evening | ORAL | Status: DC | PRN

## 2015-04-22 NOTE — Progress Notes (Signed)
LABOR PROGRESS NOTE  Rhonda Mccarthy is a 25 y.o. G2P1001 at 4992w6d  admitted for PROM/ onset of labor  Subjective: Patient is reporting increasing pain/ contractions.  She is asking for pain medications.  Objective: BP 123/72 mmHg  Pulse 115  Temp(Src) 98.7 F (37.1 C) (Oral)  Resp 20  Ht 4\' 11"  (1.499 m)  Wt 129 lb (58.514 kg)  BMI 26.04 kg/m2  SpO2 98%  LMP 07/24/2014 or  Filed Vitals:   04/22/15 0529 04/22/15 0559 04/22/15 0600 04/22/15 0620  BP: 133/77 123/72    Pulse: 102 115    Temp:    98.7 F (37.1 C)  TempSrc:    Oral  Resp:   18 20  Height:      Weight:      SpO2:        Fetal monitoringBaseline: 130 bpm, Variability: Good {> 6 bpm), Accelerations: Reactive and Decelerations: 1 late and several early decels seen Uterine activityFrequency: Every 2-3 minutes   Dilation: 6 Effacement (%): 70 Cervical Position: Posterior Station: -2 Presentation: Vertex Exam by:: M.Merrill, RN  Labs: Lab Results  Component Value Date   WBC 13.2* 04/22/2015   HGB 8.7* 04/22/2015   HCT 28.4* 04/22/2015   MCV 75.9* 04/22/2015   PLT 202 04/22/2015    Patient Active Problem List   Diagnosis Date Noted  . Amniotic fluid leaking 04/22/2015  . Dizziness 04/03/2015  . Anemia affecting pregnancy in second trimester 02/02/2015  . Rh negative status during pregnancy 02/02/2015  . Supervision of normal pregnancy in second trimester 11/29/2014    Assessment / Plan: 25 y.o. G2P1001 at 4192w6d here for PROM/ onset of labor  Labor: progressing.   Fetal Wellbeing:  Cat 2 > Cat 1, one late decel appreciated.  Patient placed on her L lateral side Pain Control:  Fentanyl 100 x1.  Patient declining epidural Anticipated MOD:  SVD  Delynn FlavinAshly Hadleigh Felber, DO PGY-2, [facility] 04/22/2015, 7:06 AM

## 2015-04-22 NOTE — MAU Note (Signed)
Leaking yellowish fld since 0200. Contractions

## 2015-04-22 NOTE — Anesthesia Preprocedure Evaluation (Signed)

## 2015-04-22 NOTE — Progress Notes (Signed)
Rhonda Mccarthy is a 25 y.o. G2P1001 at 3619w6d by ultrasound admitted for rupture of membranes  Subjective:   Objective: BP 110/69 mmHg  Pulse 97  Temp(Src) 98.2 F (36.8 C) (Oral)  Resp 18  Ht 4\' 11"  (1.499 m)  Wt 129 lb (58.514 kg)  BMI 26.04 kg/m2  SpO2 96%  LMP 07/24/2014      FHT:  FHR: 140's bpm, variability: moderate,  accelerations:  Present,  decelerations:  Present variable UC:   regular, every 2 minutes SVE:   Dilation: 7 Effacement (%): 90 Station: -2 Exam by:: patti moore rn  Labs: Lab Results  Component Value Date   WBC 13.2* 04/22/2015   HGB 8.7* 04/22/2015   HCT 28.4* 04/22/2015   MCV 75.9* 04/22/2015   PLT 202 04/22/2015    Assessment / Plan: Augmentation of labor, progressing well  Labor: Progressing normally Preeclampsia:  no signs or symptoms of toxicity and intake and ouput balanced Fetal Wellbeing:  Category II Pain Control:  IV pain meds I/D:  n/a Anticipated MOD:  NSVD  Seung Nidiffer DARLENE 04/22/2015, 10:49 AM

## 2015-04-22 NOTE — Anesthesia Procedure Notes (Signed)
Epidural Patient location during procedure: OB  Staffing Anesthesiologist: Jamillah Camilo Performed by: anesthesiologist   Preanesthetic Checklist Completed: patient identified, site marked, surgical consent, pre-op evaluation, timeout performed, IV checked, risks and benefits discussed and monitors and equipment checked  Epidural Patient position: sitting Prep: DuraPrep Patient monitoring: heart rate, continuous pulse ox and blood pressure Approach: right paramedian Location: L3-L4 Injection technique: LOR saline  Needle:  Needle type: Tuohy  Needle gauge: 17 G Needle length: 9 cm and 9 Needle insertion depth: 4 cm Catheter type: closed end flexible Catheter size: 20 Guage Catheter at skin depth: 8 cm Test dose: negative  Assessment Events: blood not aspirated, injection not painful, no injection resistance, negative IV test and no paresthesia  Additional Notes Patient identified. Risks/Benefits/Options discussed with patient including but not limited to bleeding, infection, nerve damage, paralysis, failed block, incomplete pain control, headache, blood pressure changes, nausea, vomiting, reactions to medication both or allergic, itching and postpartum back pain. Confirmed with bedside nurse the patient's most recent platelet count. Confirmed with patient that they are not currently taking any anticoagulation, have any bleeding history or any family history of bleeding disorders. Patient expressed understanding and wished to proceed. All questions were answered. Sterile technique was used throughout the entire procedure. Please see nursing notes for vital signs. Test dose was given through epidural needle and negative prior to continuing to dose epidural or start infusion. Warning signs of high block given to the patient including shortness of breath, tingling/numbness in hands, complete motor block, or any concerning symptoms with instructions to call for help. Patient was given  instructions on fall risk and not to get out of bed. All questions and concerns addressed with instructions to call with any issues.   

## 2015-04-22 NOTE — H&P (Signed)
LABOR ADMISSION HISTORY AND PHYSICAL  Rhonda Mccarthy is a 25 y.o. female G2P1001 with IUP at 2130w6d by LMP presenting for PROM sometime between 1-2 am today. She reports +FMs.  She reports her last pregnancy an delivery was and uncomplicated SVD. Endorses LOF.  Denies VB, blurry vision, headaches, peripheral edema, RUQ pain.  She plans on breast feeding. She is undecided for birth control.  Plans on bringing child to [facility]) for pediatric care after discharge.   Dating: By LMP --->  Estimated Date of Delivery: 04/30/15  Sono:    @[redacted]w[redacted]d , normal anatomy, cephalic presentation, 3304g, 16%66% EFW   Prenatal History/Complications: [facility]  Dating  LMP Blood type: A/NEG/-- (11/09 1411)   Genetic Screen  none Antibody:NEG (11/09 1411)  Anatomic US  11/16 normal female Rubella: 1.71 (11/09 1411)  GTT Early:               Third trimester: 113 RPR: NON REAC (01/11 1424)   Flu vaccine  declined HBsAg: NEGATIVE (11/09 1411)   TDaP vaccine     01/31/15                        Rhogam: HIV: NONREACTIVE (01/11 1424)   Baby Food    Bottle/breast                                     GBS:   neg  Contraception  OCP Pap: wnl 11/16  Circumcision    Pediatrician  undecided   Support Person  FOB- Christopher    Past Medical History: History reviewed. No pertinent past medical history.  Past Surgical History: History reviewed. No pertinent past surgical history.  Obstetrical History: OB History    Gravida Para Term Preterm AB TAB SAB Ectopic Multiple Living   2 1 1       1       Social History: Social History   Social History  . Marital Status: Single    Spouse Name: N/A  . Number of Children: N/A  . Years of Education: N/A   Social History Main Topics  . Smoking status: Never Smoker   . Smokeless tobacco: Never Used  . Alcohol Use: No  . Drug Use: No  . Sexual Activity: Yes   Other Topics Concern  . None   Social History Narrative     Family History: No family history on file.  Allergies: No Known Allergies  Prescriptions prior to admission  Medication Sig Dispense Refill Last Dose  . ferrous sulfate (FE TABS) 325 (65 FE) MG EC tablet Take 1 tablet (325 mg total) by mouth 2 (two) times daily. 60 tablet 3 Taking  . prenatal vitamin w/FE, FA (PRENATAL 1 + 1) 27-1 MG TABS tablet Take 1 tablet by mouth daily at 12 noon. 30 each 4 Taking     Review of Systems   All systems reviewed and negative except as stated in HPI  Blood pressure 123/81, pulse 87, temperature 98.1 F (36.7 C), resp. rate 18, height 4\' 11"  (1.499 m), weight 129 lb (58.514 kg), last menstrual period 07/24/2014. General appearance: alert, cooperative, appears stated age and uncomfortable Lungs: clear to auscultation bilaterally, no increased WOB Heart: regular rate and rhythm, no m/r/g Abdomen: soft, non-tender; bowel sounds normal Pelvic: Dilation: 5.5 Effacement (%): 90 Station: -1 Presentation: Vertex Exam by:: Aldine ContesBenji Stanley RN  Extremities: WWP, Homans sign is negative, no sign of DVT, +2 DP, +trace pedal edeme Neuro: follows commands, no focal deficits Presentation: cephalic Fetal monitoringBaseline: 132 bpm, Variability: Good {> 6 bpm) and Accelerations: Reactive Uterine activity Regular, Frequency: Every 2-3 minutes Dilation: 5.5 Effacement (%): 90 Station: -1 Exam by:: Remigio Eisenmenger RN  Prenatal labs: ABO, Rh: A/NEG/-- (11/09 1411) Antibody: NEG (11/09 1411) Rubella: !Error! IMMUNE RPR: NON REAC (01/11 1424)  HBsAg: NEGATIVE (11/09 1411)  HIV: NONREACTIVE (01/11 1424)  GBS: Negative (03/14 0000)  1 hr Glucola 113 Genetic screening  none Anatomy US normal  Prenatal Transfer Tool  Maternal Diabetes: No Genetic Screening: Declined Maternal Ultrasounds/Referrals: Normal Fetal Ultrasounds or other Referrals:  None Maternal Substance Abuse:  No Significant Maternal Medications:  None Significant Maternal Lab Results:  None  No results found for this or any previous visit (from the past 24 hour(s)).  Patient Active Problem List   Diagnosis Date Noted  . Dizziness 04/03/2015  . Anemia affecting pregnancy in second trimester 02/02/2015  . Rh negative status during pregnancy 02/02/2015  . Supervision of normal pregnancy in second trimester 11/29/2014    Assessment: Rhonda Mccarthy is a 25 y.o. G2P1001 at [redacted]w[redacted]d here for onset of labor with spontaneous rupture of membranes.  Patient has a PMH of Rh-, for which she received prophylaxis.  The prophylaxis was administered about 1 week late.  #Labor: anticipate NSVD #Pain: Fentanyl 100 mg, may have epidural on request #FWB: Cat 1 #ID:  GBS negative #MOF: Breast #MOC:undecided #Circ:  undecided  Delynn Flavin, DO PGY-2, [facility] 04/22/2015, 4:29 AM   I spoke with and examined patient and agree with resident/PA/SNM's note and plan of care.  SROM clear fluid ~0130 w/ uc's. Reports good fm. No vb. Pregnancy complicated by anemia, Rh neg. Plans to breast/bottle, pills for contraception, boy- undecided about circ.  Expectant management, anticipate NSVB. Cheral Marker, CNM, [facility] 04/22/2015 6:44 AM

## 2015-04-23 ENCOUNTER — Ambulatory Visit (HOSPITAL_COMMUNITY): Payer: Medicaid Other

## 2015-04-23 NOTE — Lactation Note (Signed)
This note was copied from a baby's chart. Lactation Consultation Note  Offered interpreter but mother states she did not an interpreter. Mother and baby resting.  Mother states at weeks her milk supply stopped. Mother was supplementing with formula but states not a lot. Encouraged her to call if this happens again so we can help her.  Provided mother w/ lactation phone number. Reviewed basics, stomach size and supply and demand. Discussed cluster feeding. Mom encouraged to feed baby 8-12 times/24 hours and with feeding cues.  Mom made aware of O/P services, breastfeeding support groups, community resources, and our phone # for post-discharge questions.    Patient Name: Boy Burna SisFabiola Abdon ZOXWR'UToday's Date: 04/23/2015 Reason for consult: Initial assessment   Maternal Data Has patient been taught Hand Expression?: No Does the patient have breastfeeding experience prior to this delivery?: Yes  Feeding Feeding Type: Breast Fed Length of feed: 20 min  LATCH Score/Interventions                      Lactation Tools Discussed/Used     Consult Status Consult Status: Follow-up Date: 04/24/15 Follow-up type: In-patient    Dahlia ByesBerkelhammer, Nereyda Bowler [facility] 04/23/2015, 12:53 PM

## 2015-04-23 NOTE — Anesthesia Postprocedure Evaluation (Signed)
Anesthesia Post Note  Patient: Rhonda Mccarthy  Procedure(s) Performed: * No procedures listed *  Patient location during evaluation: Mother Baby Anesthesia Type: Epidural Level of consciousness: awake and alert Pain management: pain level controlled Vital Signs Assessment: post-procedure vital signs reviewed and stable Respiratory status: spontaneous breathing, nonlabored ventilation and respiratory function stable Cardiovascular status: stable Postop Assessment: no headache, no backache and epidural receding Anesthetic complications: no    Last Vitals:  Filed Vitals:   04/23/15 0030 04/23/15 0547  BP: 117/67 110/60  Pulse: 81 103  Temp: 37.2 C 36.9 C  Resp: 18 18    Last Pain:  Filed Vitals:   04/23/15 0700  PainSc: 5                  Marlyss Cissell

## 2015-04-23 NOTE — Progress Notes (Signed)
Post Partum Day 1 Subjective: no complaints, up ad lib, voiding and tolerating PO  Objective: Blood pressure 110/60, pulse 103, temperature 98.5 F (36.9 C), temperature source Oral, resp. rate 18, height 4\' 11"  (1.499 m), weight 129 lb (58.514 kg), last menstrual period 07/24/2014, SpO2 96 %, unknown if currently breastfeeding.  Physical Exam:  General: alert, cooperative, appears stated age and no distress Lochia: appropriate Uterine Fundus: firm Incision: n/a DVT Evaluation: No evidence of DVT seen on physical exam. Negative Homan's sign. No cords or calf tenderness.   Recent Labs  04/22/15 0445  HGB 8.7*  HCT 28.4*    Assessment/Plan: Plan for discharge tomorrow   LOS: 1 day   LAWSON, MARIE DARLENE 04/23/2015, 7:43 AM

## 2015-04-24 MED ORDER — IBUPROFEN 600 MG PO TABS: 600.0000 mg | ORAL_TABLET | Freq: Four times a day (QID) | ORAL | Status: AC

## 2015-04-24 NOTE — Discharge Summary (Signed)
OB Discharge Summary  Patient Name: Rhonda Mccarthy DOB: [birth year] MRN: [id number]  Date of admission: 04/22/2015 Delivering MD: Wyvonnia Dusky D   Date of discharge: 04/24/2015  Admitting diagnosis: 38wks, water broke, contractions Intrauterine pregnancy: [redacted]w[redacted]d     Secondary diagnosis:Active Problems:   Rh negative status during pregnancy   Amniotic fluid leaking  Additional problems: None     Discharge diagnosis: Term Pregnancy Delivered                                                                     Post partum procedures:none  Augmentation: none  Complications: None  Hospital course:  Onset of Labor With Vaginal Delivery     25 y.o. yo [id number] at [redacted]w[redacted]d was admitted in Active Labor on 04/22/2015. Patient had an uncomplicated labor course as follows:  Membrane Rupture Time/Date: 1:00 AM ,04/22/2015   Intrapartum Procedures: Episiotomy: None [1]                                         Lacerations:  None [1]  Patient had a delivery of a Viable infant. 04/22/2015  Information for the patient's newborn:  Theresa, Wedel [[id number]]  Delivery Method: Vaginal, Spontaneous Delivery (Filed from Delivery Summary)    Pateint had an uncomplicated postpartum course.  She is ambulating, tolerating a regular diet, passing flatus, and urinating well. Patient is discharged home in stable condition on 04/24/2015.    Physical exam  Filed Vitals:   04/23/15 0030 04/23/15 0547 04/23/15 1830 04/24/15 0515  BP: 117/67 110/60 121/74 105/59  Pulse: 81 103 99 86  Temp: 98.9 F (37.2 C) 98.5 F (36.9 C) 98.1 F (36.7 C) 97.4 F (36.3 C)  TempSrc: Oral Oral Oral Oral  Resp: Height:      Weight:      SpO2:    98%   General: alert Lochia: appropriate Uterine Fundus: firm Incision: N/A DVT Evaluation: No evidence of DVT seen on physical exam. Labs: Lab Results  Component Value Date   WBC 13.2* 04/22/2015   HGB 8.7* 04/22/2015   HCT 28.4* 04/22/2015   MCV  75.9* 04/22/2015   PLT 202 04/22/2015   No flowsheet data found.  Discharge instruction: per After Visit Summary and "Baby and Me Booklet".  After Visit Meds:    Medication List    TAKE these medications        ferrous sulfate 325 (65 FE) MG EC tablet  Commonly known as:  FE TABS  Take 1 tablet (325 mg total) by mouth 2 (two) times daily.     ibuprofen 600 MG tablet  Commonly known as:  ADVIL,MOTRIN  Take 1 tablet (600 mg total) by mouth every 6 (six) hours.     prenatal vitamin w/FE, FA 27-1 MG Tabs tablet  Take 1 tablet by mouth daily at 12 noon.        Diet: routine diet  Activity: Advance as tolerated. Pelvic rest for 6 weeks.   Outpatient follow up:6 weeks Follow up Appt:No future appointments. Follow up visit: No Follow-up on file.  Postpartum contraception: Nexplanon  Newborn  Data: Live born female  Birth Weight: 8 lb 1.6 oz (3675 g) APGAR: 9, 9  Baby Feeding: Bottle and Breast Disposition:home with mother   04/24/2015 Hilton SinclairKaty D Mayo, MD   CNM attestation:  I have seen and examined this patient; I agree with above documentation in the Resident's note.    Clemmons,Lori Grissett, CNM 9:41 AM

## 2015-04-24 NOTE — Discharge Instructions (Signed)

## 2015-04-24 NOTE — Lactation Note (Signed)
This note was copied from a baby's chart. Lactation Consultation Note; Mom reports pain with nursing that lasts through the whole feeding. Nipples raw on tips. Breasts are beginning to fill. Mom has been giving bottles of formula due to pain. Has not pumped or nursed in more than 5 hours. Reviewed engorgement prevention and treatment. Mom pumping with DEBP and baby woke. Attempted to latch. Attempted to latch in football position on both breasts but mom reports pain on nipples and cramping. Baby getting sleepy. Encouraged mom to continue pumping to soften breasts. Mom bottle fed 20 cc's EBM to baby. Pumping as I left room. Has WIC- plans to call them about a pump. Manual for home at present. No questions at present. To call for assist prn. Has comfort gels for nipples- reviewed use and cleaning.   Patient Name: Boy Burna SisFabiola Balboni [id number]'s Date: 04/24/2015 Reason for consult: Follow-up assessment   Maternal Data Formula Feeding for Exclusion: Yes Reason for exclusion: Mother's choice to formula and breast feed on admission Has patient been taught Hand Expression?: Yes Does the patient have breastfeeding experience prior to this delivery?: Yes  Feeding Feeding Type: Breast Fed Length of feed: 5 min  LATCH Score/Interventions Latch: Repeated attempts needed to sustain latch, nipple held in mouth throughout feeding, stimulation needed to elicit sucking reflex.  Audible Swallowing: None  Type of Nipple: Flat  Comfort (Breast/Nipple): Filling, red/small blisters or bruises, mild/mod discomfort  Problem noted: Filling;Severe discomfort Interventions (Filling): Frequent nursing Interventions (Mild/moderate discomfort): Comfort gels;Hand expression;Post-pump;Hand massage Interventions (Severe discomfort): Double electric pum  Hold (Positioning): Assistance needed to correctly position infant at breast and maintain latch. Intervention(s): Breastfeeding basics reviewed  LATCH Score:  4  Lactation Tools Discussed/Used WIC Program: Yes   Consult Status Consult Status: Complete    Pamelia HoitWeeks, Zyquan Crotty D 04/24/2015, 11:38 AM

## 2015-04-26 ENCOUNTER — Encounter: Payer: Medicaid Other | Admitting: Family

## 2015-04-26 LAB — TYPE AND SCREEN
ABO/RH(D): A NEG
ANTIBODY SCREEN: POSITIVE
DAT, IGG: NEGATIVE
UNIT DIVISION: 0
UNIT DIVISION: 0

## 2015-04-27 NOTE — Discharge Summary (Signed)
OB Discharge Summary  Patient Name: Rhonda Mccarthy DOB: [birth year] MRN: [id number]  Date of admission: 04/22/2015 Delivering MD: Wyvonnia DuskyLAWSON, MARIE D   Date of discharge: 04/27/2015  Admitting diagnosis: 38wks, water broke, contractions Intrauterine pregnancy: 376w6d     Secondary diagnosis:Active Problems:   Rh negative status during pregnancy   Amniotic fluid leaking  Additional problems: None     Discharge diagnosis: Term Pregnancy Delivered                                                                     Post partum procedures:none  Augmentation: none  Complications: None  Hospital course:  Onset of Labor With Vaginal Delivery     25 y.o. yo [id number] at 418w6d was admitted in Active Labor on 04/22/2015. Patient had an uncomplicated labor course as follows:  Membrane Rupture Time/Date: 1:00 AM ,04/22/2015   Intrapartum Procedures: Episiotomy: None [1]                                         Lacerations:  None [1]  Patient had a delivery of a Viable infant. 04/22/2015  Information for the patient's newborn:  Nada LibmanRodriguez, Nathan Eli [[id number]][[id number]]  Delivery Method: Vaginal, Spontaneous Delivery (Filed from Delivery Summary)     Pateint had an uncomplicated postpartum course.  She is ambulating, tolerating a regular diet, passing flatus, and urinating well. Patient is discharged home in stable condition on 04/27/2015.    Physical exam  Filed Vitals:   04/23/15 0030 04/23/15 0547 04/23/15 1830 04/24/15 0515  BP: 117/67 110/60 121/74 105/59  Pulse: 81 103 99 86  Temp: 98.9 F (37.2 C) 98.5 F (36.9 C) 98.1 F (36.7 C) 97.4 F (36.3 C)  TempSrc: Oral Oral Oral Oral  Resp: 18 18 18 18   Height:      Weight:      SpO2:    98%   General: alert Lochia: appropriate Uterine Fundus: firm Incision: N/A DVT Evaluation: No evidence of DVT seen on physical exam. Labs: Lab Results  Component Value Date   WBC 13.2* 04/22/2015   HGB 8.7* 04/22/2015   HCT 28.4* 04/22/2015   MCV  75.9* 04/22/2015   PLT 202 04/22/2015   No flowsheet data found.  Discharge instruction: per After Visit Summary and "Baby and Me Booklet".  After Visit Meds:    Medication List    TAKE these medications        ferrous sulfate 325 (65 FE) MG EC tablet  Commonly known as:  FE TABS  Take 1 tablet (325 mg total) by mouth 2 (two) times daily.     ibuprofen 600 MG tablet  Commonly known as:  ADVIL,MOTRIN  Take 1 tablet (600 mg total) by mouth every 6 (six) hours.     prenatal vitamin w/FE, FA 27-1 MG Tabs tablet  Take 1 tablet by mouth daily at 12 noon.        Diet: routine diet  Activity: Advance as tolerated. Pelvic rest for 6 weeks.   Outpatient follow up:6 weeks Follow up Appt:No future appointments. Follow up visit: No Follow-up on file.  Postpartum contraception: Nexplanon  Newborn Data: Live born female  Birth Weight: 8 lb 1.6 oz (3675 g) APGAR: 9, 9  Baby Feeding: Bottle and Breast Disposition:home with mother   04/27/2015 Leeroy Cha, CNM

## 2015-06-01 ENCOUNTER — Ambulatory Visit: Payer: Medicaid Other | Admitting: Family Medicine

## 2017-12-03 ENCOUNTER — Encounter: Payer: Self-pay | Admitting: *Deleted

## 2018-06-30 ENCOUNTER — Encounter: Payer: Self-pay | Admitting: *Deleted

## 2020-11-21 ENCOUNTER — Encounter: Payer: Self-pay | Admitting: *Deleted
